# Patient Record
Sex: Female | Born: 1970 | Race: White | Hispanic: Yes | Marital: Married | Smoking: Never smoker
Health system: Southern US, Community
[De-identification: ages and names within clinical notes are randomized; demographics above are authoritative.]

## PROBLEM LIST (undated history)

## (undated) DIAGNOSIS — N3281 Overactive bladder: Secondary | ICD-10-CM

## (undated) DIAGNOSIS — D649 Anemia, unspecified: Secondary | ICD-10-CM

## (undated) DIAGNOSIS — N879 Dysplasia of cervix uteri, unspecified: Secondary | ICD-10-CM

## (undated) DIAGNOSIS — E559 Vitamin D deficiency, unspecified: Secondary | ICD-10-CM

## (undated) DIAGNOSIS — T7840XA Allergy, unspecified, initial encounter: Secondary | ICD-10-CM

## (undated) DIAGNOSIS — E221 Hyperprolactinemia: Secondary | ICD-10-CM

## (undated) HISTORY — DX: Vitamin D deficiency, unspecified: E55.9

## (undated) HISTORY — DX: Overactive bladder: N32.81

## (undated) HISTORY — DX: Hyperprolactinemia: E22.1

## (undated) HISTORY — DX: Allergy, unspecified, initial encounter: T78.40XA

## (undated) HISTORY — DX: Dysplasia of cervix uteri, unspecified: N87.9

## (undated) HISTORY — PX: CHOLECYSTECTOMY: SHX55

## (undated) HISTORY — DX: Anemia, unspecified: D64.9

---

## 2000-04-24 ENCOUNTER — Other Ambulatory Visit: Admission: RE | Admit: 2000-04-24 | Discharge: 2000-04-24 | Payer: Self-pay | Admitting: Gynecology

## 2000-05-21 ENCOUNTER — Ambulatory Visit (HOSPITAL_COMMUNITY): Admission: RE | Admit: 2000-05-21 | Discharge: 2000-05-21 | Payer: Self-pay | Admitting: Gynecology

## 2000-05-21 ENCOUNTER — Encounter: Payer: Self-pay | Admitting: Gynecology

## 2001-05-02 ENCOUNTER — Other Ambulatory Visit: Admission: RE | Admit: 2001-05-02 | Discharge: 2001-05-02 | Payer: Self-pay | Admitting: Gynecology

## 2001-06-04 ENCOUNTER — Encounter: Admission: RE | Admit: 2001-06-04 | Discharge: 2001-09-02 | Payer: Self-pay | Admitting: Gynecology

## 2001-10-12 ENCOUNTER — Encounter (INDEPENDENT_AMBULATORY_CARE_PROVIDER_SITE_OTHER): Payer: Self-pay | Admitting: Specialist

## 2001-10-12 ENCOUNTER — Inpatient Hospital Stay (HOSPITAL_COMMUNITY): Admission: AD | Admit: 2001-10-12 | Discharge: 2001-10-15 | Payer: Self-pay | Admitting: Gynecology

## 2001-11-27 ENCOUNTER — Other Ambulatory Visit: Admission: RE | Admit: 2001-11-27 | Discharge: 2001-11-27 | Payer: Self-pay | Admitting: Gynecology

## 2002-10-02 ENCOUNTER — Encounter (INDEPENDENT_AMBULATORY_CARE_PROVIDER_SITE_OTHER): Payer: Self-pay | Admitting: Specialist

## 2002-10-02 ENCOUNTER — Ambulatory Visit (HOSPITAL_COMMUNITY): Admission: RE | Admit: 2002-10-02 | Discharge: 2002-10-02 | Payer: Self-pay | Admitting: Gynecology

## 2003-02-18 ENCOUNTER — Other Ambulatory Visit: Admission: RE | Admit: 2003-02-18 | Discharge: 2003-02-18 | Payer: Self-pay | Admitting: Gynecology

## 2003-03-05 ENCOUNTER — Other Ambulatory Visit: Admission: RE | Admit: 2003-03-05 | Discharge: 2003-03-05 | Payer: Self-pay | Admitting: Gynecology

## 2003-06-21 ENCOUNTER — Inpatient Hospital Stay (HOSPITAL_COMMUNITY): Admission: AD | Admit: 2003-06-21 | Discharge: 2003-06-21 | Payer: Self-pay | Admitting: Gynecology

## 2003-10-01 ENCOUNTER — Inpatient Hospital Stay (HOSPITAL_COMMUNITY): Admission: AD | Admit: 2003-10-01 | Discharge: 2003-10-03 | Payer: Self-pay | Admitting: Gynecology

## 2003-12-08 ENCOUNTER — Other Ambulatory Visit: Admission: RE | Admit: 2003-12-08 | Discharge: 2003-12-08 | Payer: Self-pay | Admitting: Gynecology

## 2004-06-13 ENCOUNTER — Other Ambulatory Visit: Admission: RE | Admit: 2004-06-13 | Discharge: 2004-06-13 | Payer: Self-pay | Admitting: Gynecology

## 2004-12-29 ENCOUNTER — Inpatient Hospital Stay (HOSPITAL_COMMUNITY): Admission: AD | Admit: 2004-12-29 | Discharge: 2004-12-31 | Payer: Self-pay | Admitting: Gynecology

## 2005-02-14 ENCOUNTER — Other Ambulatory Visit: Admission: RE | Admit: 2005-02-14 | Discharge: 2005-02-14 | Payer: Self-pay | Admitting: Gynecology

## 2007-05-15 ENCOUNTER — Ambulatory Visit (HOSPITAL_COMMUNITY): Admission: RE | Admit: 2007-05-15 | Discharge: 2007-05-15 | Payer: Self-pay | Admitting: Gynecology

## 2007-05-23 ENCOUNTER — Encounter: Admission: RE | Admit: 2007-05-23 | Discharge: 2007-05-23 | Payer: Self-pay | Admitting: Gynecology

## 2007-07-09 ENCOUNTER — Other Ambulatory Visit: Admission: RE | Admit: 2007-07-09 | Discharge: 2007-07-09 | Payer: Self-pay | Admitting: Gynecology

## 2008-03-30 ENCOUNTER — Ambulatory Visit: Payer: Self-pay | Admitting: Gynecology

## 2008-07-23 ENCOUNTER — Encounter: Payer: Self-pay | Admitting: Gynecology

## 2008-07-23 ENCOUNTER — Other Ambulatory Visit: Admission: RE | Admit: 2008-07-23 | Discharge: 2008-07-23 | Payer: Self-pay | Admitting: Gynecology

## 2008-07-23 ENCOUNTER — Ambulatory Visit: Payer: Self-pay | Admitting: Gynecology

## 2008-09-08 ENCOUNTER — Encounter: Admission: RE | Admit: 2008-09-08 | Discharge: 2008-09-08 | Payer: Self-pay | Admitting: Gynecology

## 2009-03-09 IMAGING — MG MM DIGITAL SCREENING BILAT
5 series · 5 of 5 positions shown · non-contrast
Comparison: none

DG SCREEN MAMMOGRAM BILATERAL
Bilateral CC and MLO view(s) were taken.
Technologist: Dhindsa, Daylen.(EBADAT)(M)

DIGITAL SCREENING MAMMOGRAM WITH CAD:
The breast tissue is heterogeneously dense.  There are calcifications in the right breast.  
Characterization with magnification views is recommended.  The left breast is unremarkable.

[R CC (1 of 2)]
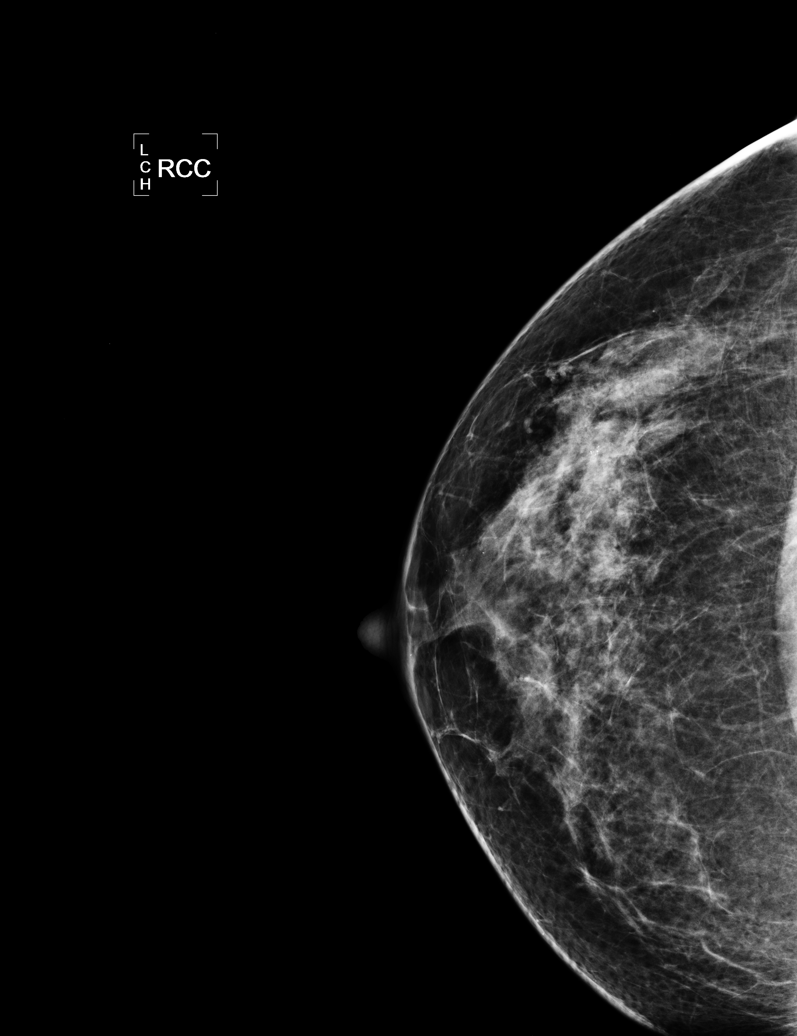

[R MLO]
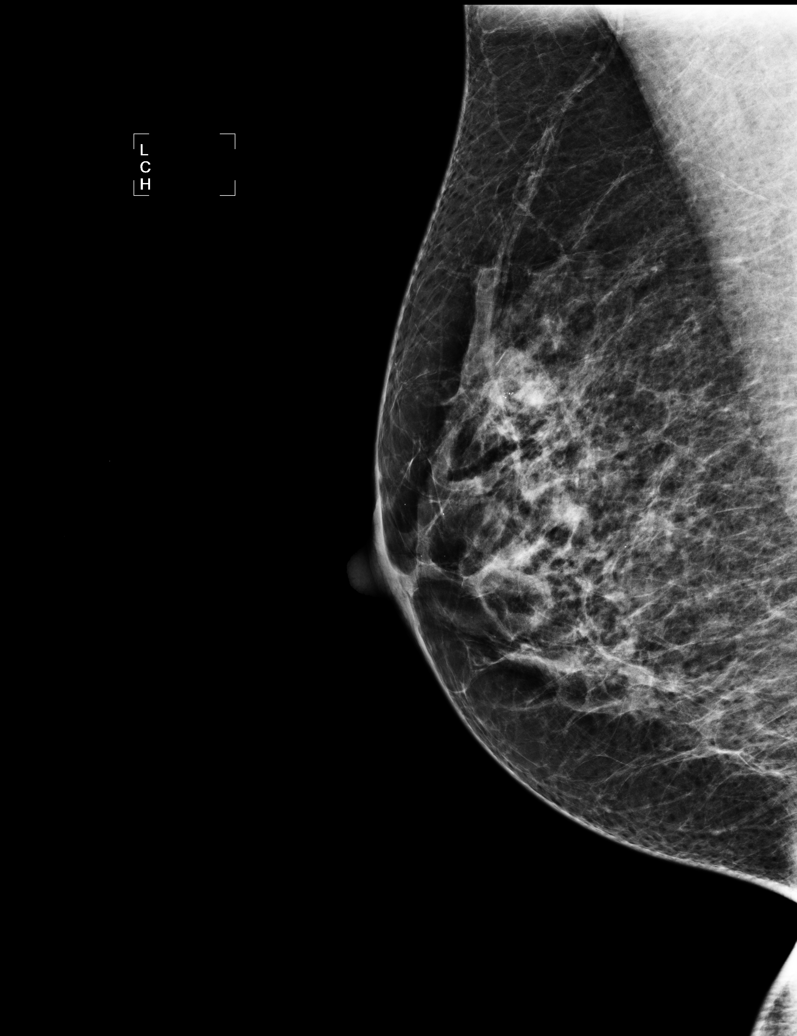

[L CC]
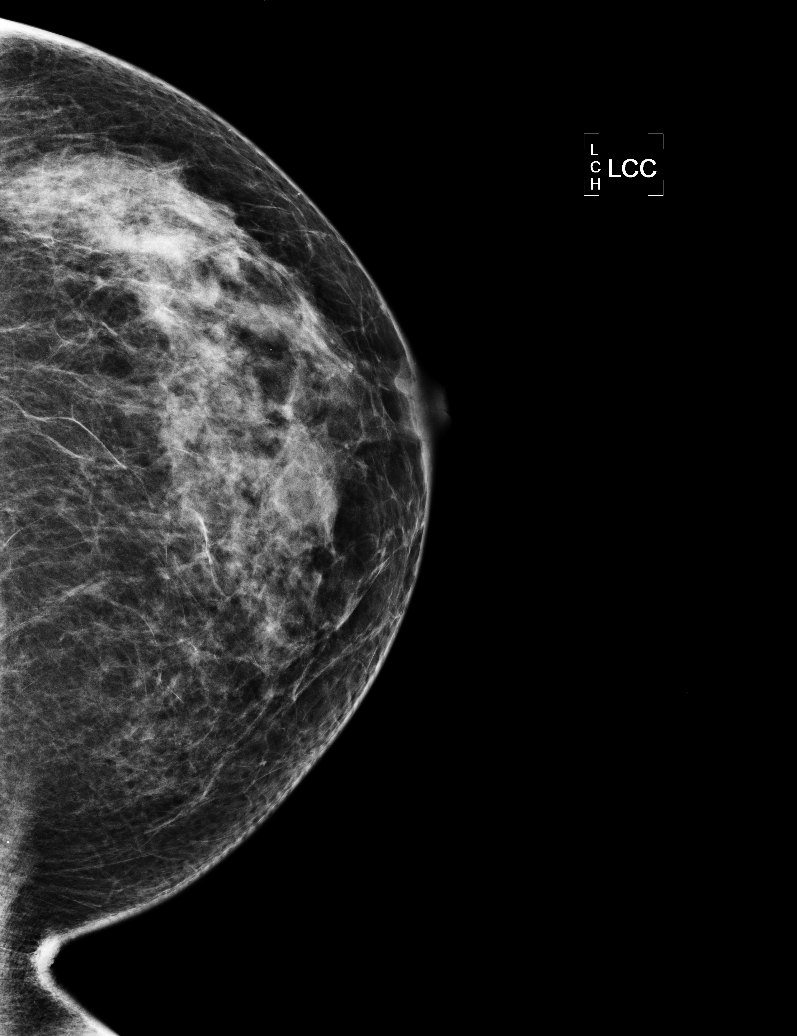

[L MLO]
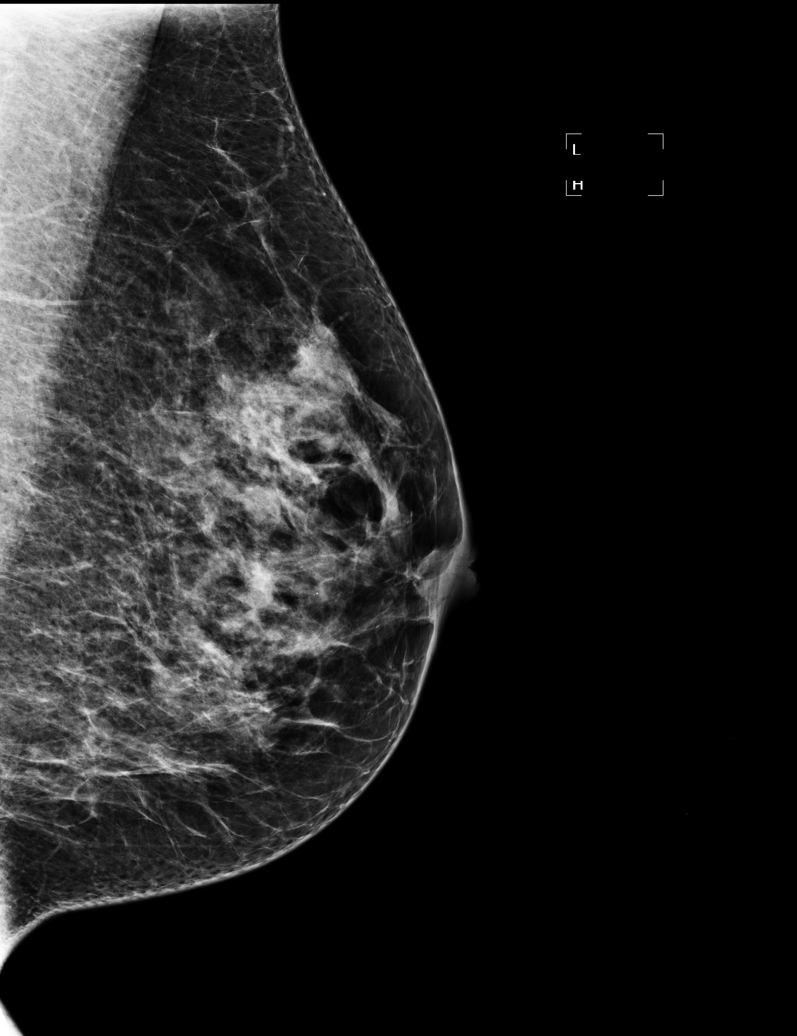

[R CC (2 of 2)]
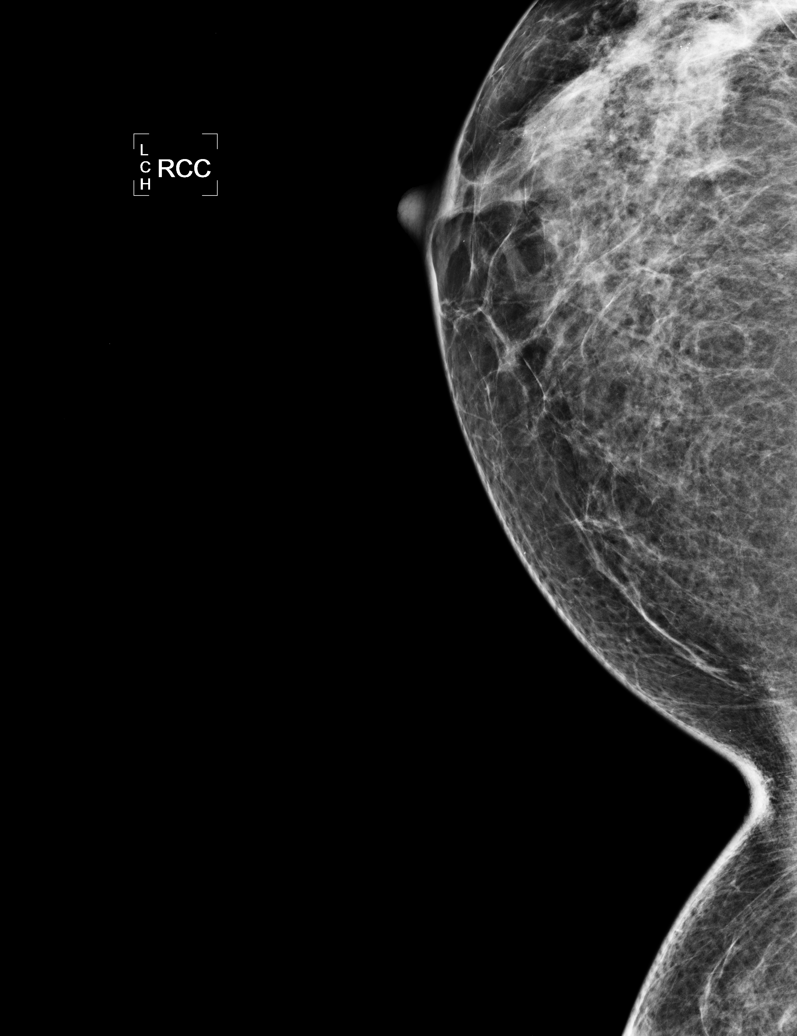

[5 of 5 positions shown; findings below may reference images not displayed]

IMPRESSION: Calcifications, right breast.  Additional evaluation is indicated.  The patient will be contacted 
for additional studies and a supplemental report will follow.

ASSESSMENT: Need additional imaging evaluation and/or prior mammograms for comparison - BI-RADS 0 -
Right

Further imaging of the right breast.
ANALYZED BY COMPUTER AIDED DETECTION. , THIS PROCEDURE WAS A DIGITAL MAMMOGRAM.

## 2009-03-17 IMAGING — MG MM DIAGNOSTIC LTD RIGHT
4 series · 4 of 4 positions shown · non-contrast
Comparison: Mammogram 05-15-07.

[REDACTED] RIGHT
CC and MLO view(s) were taken of the right breast.

DIGITAL LIMITED RIGHT  DIAGNOSTIC MAMMOGRAM:
CLINICAL DATA: Call-back from screening mammogram for right breast microcalcifications.

[R CC (1 of 2)]
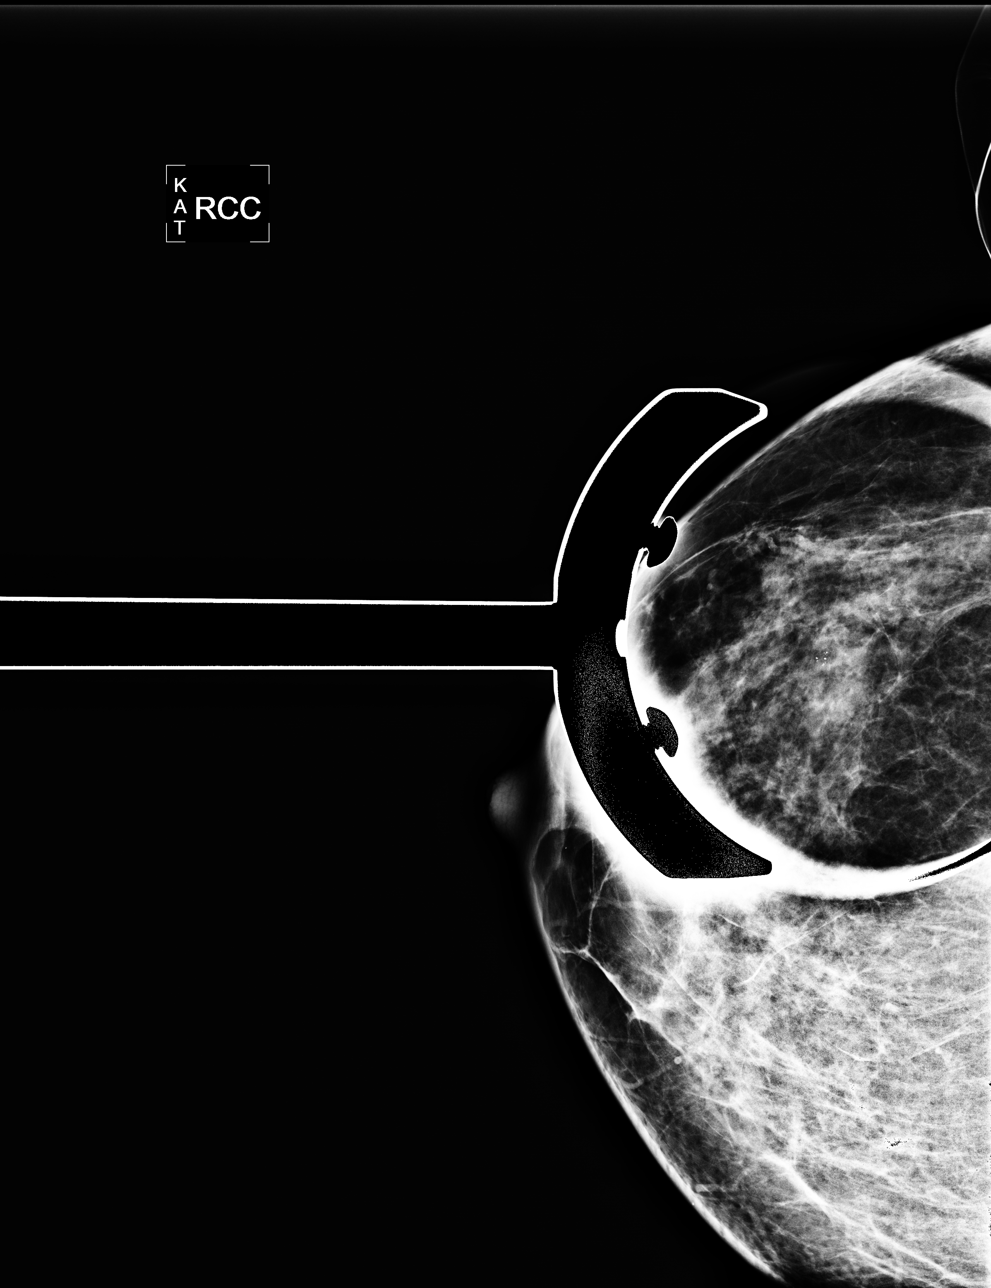

[R MLO]
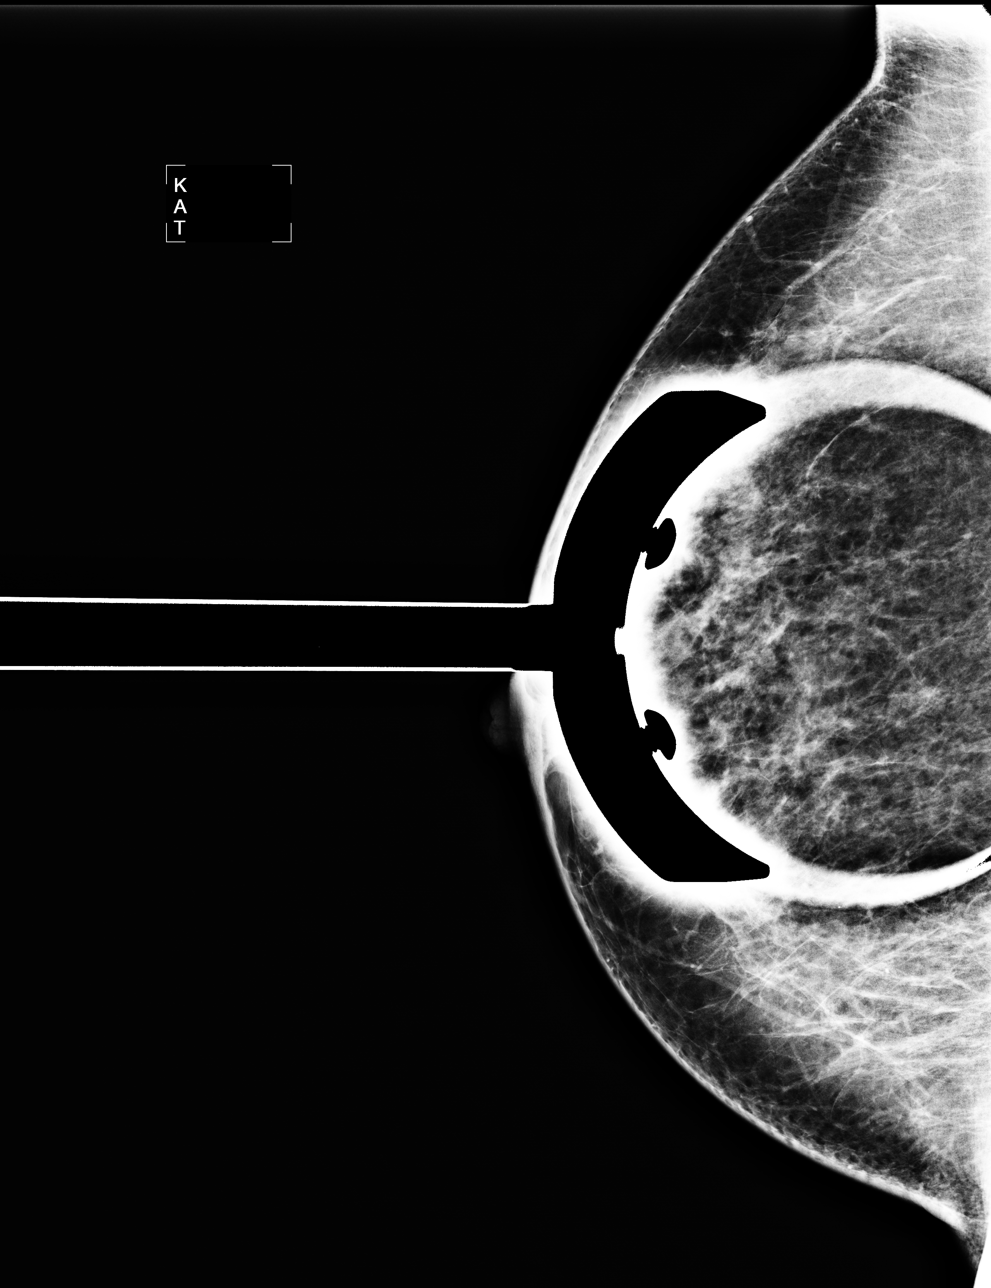

[R CC (2 of 2)]
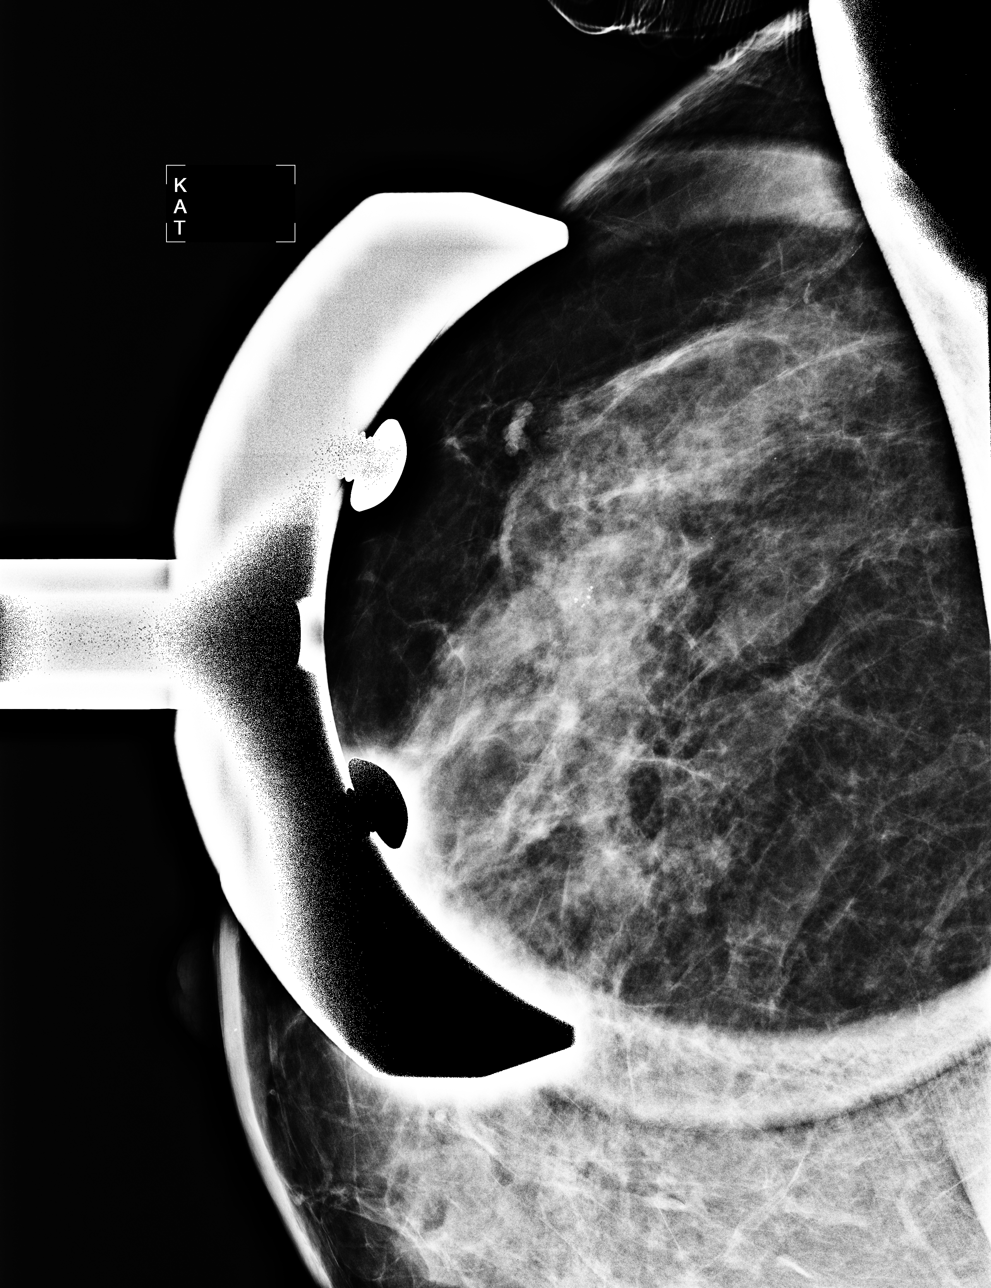

[R ML]
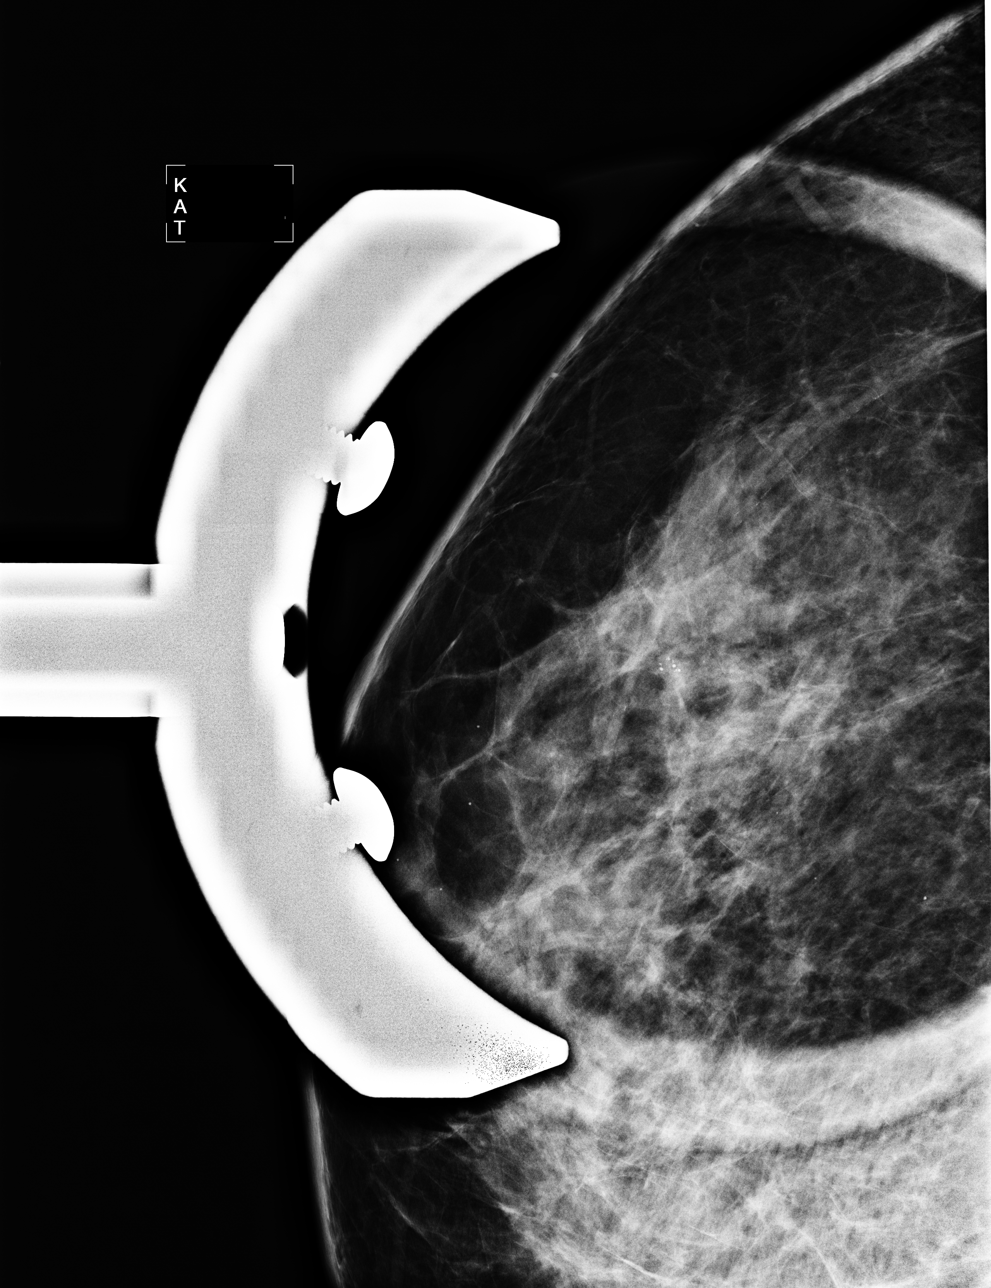

[4 of 4 positions shown; findings below may reference images not displayed]

Spot magnification CC and lateral view of the right breast are submitted for review.  The 
calcifications in the upper outer quadrant are round, punctate, coarse benign-appearing.  No 
suspicious microcalcifications are identified.
IMPRESSION: Benign findings.  Recommend routine screening mammogram at age 39.

ASSESSMENT: Benign - BI-RADS 2

Screening mammogram of both breasts at age 39.
, THIS PROCEDURE WAS A DIGITAL MAMMOGRAM.

## 2009-11-02 ENCOUNTER — Ambulatory Visit (HOSPITAL_COMMUNITY): Admission: RE | Admit: 2009-11-02 | Discharge: 2009-11-02 | Payer: Self-pay | Admitting: Gynecology

## 2010-09-01 NOTE — H&P (Signed)
NAMEGaspar Mccann                 ACCOUNT NO.:  [id number]   MEDICAL RECORD NO.:  [id number]                   PATIENT TYPE:   LOCATION:                                       FACILITY:   PHYSICIAN:  Kimberly Mccann, M.D.             DATE OF BIRTH:   DATE OF ADMISSION:  DATE OF DISCHARGE:                                HISTORY & PHYSICAL   Patient scheduled for surgery at [facility] on Friday, October 02, 2002 at  1:15 p.m.   CHIEF COMPLAINT:  First trimester missed AB.   HISTORY OF PRESENT ILLNESS:  The patient is a 40 year old, gravida 5, para  1, AB 3 with a last menstrual period of April 3 with an estimated date of  confinement of April 24, 2003; and currently would be, by last menstrual  period, approximately 10-1/[redacted] weeks gestation.  She presented to the office  yesterday, June 15, complaining of vaginal bleeding and had a quantitative  beta hCG at that time since her uterus did not correlate with a 10-week  size. The ultrasound demonstrated a nonviable intrauterine pregnancy today  consistent with 6 weeks.  A small yolk sac was noted and amnion was noted,  but no viable IUP was noted.  She did have a quantitative beta hCG on June  15 which was 10,164 million __________ units/mL and today's had dropped to  7974.   PAST MEDICAL HISTORY:  A patient with 3 previous therapeutic ABs in 1988 and  1994 respectively.  Had a normal spontaneous term pregnancy delivered at 40  weeks in 2003.  The patient with known history of hyperprolactinemia for  which she had been on __________ 0.25 mg twice a week.   ALLERGIES:  She denies any allergies.   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   FAMILY HISTORY:  Cousins and aunts with some form of Mongolism for which she  has been tested with peripheral chromosomal studies and have been normal.   PHYSICAL EXAMINATION:  HEENT:  Unremarkable.  NECK:  Supple.  Trachea is in  the midline.  No carotid bruits.  No  thyromegaly.  LUNGS:  Clear to auscultation without rhonchi or wheezes.  HEART:  Regular rate and rhythm.  No murmurs or gallops.  BREASTS: Exam not done.  ABDOMEN:  Soft, nontender, without rebound or guarding.  PELVIC:  Bartholin's, urethral and Skene's glands within normal limits.  Vagina and cervix with no gross lesions on inspection.  Vaginal blood in the  vault, but no tissue was seen on exam yesterday on June 15, and no acute  distress or any adnexal tenderness or masses.  RECTAL:  Exam not done.   ASSESSMENT:  A 40 year old gravida 5, para 1, AB 3 at 10 weeks by dates, 6  weeks by ultrasound with evidence of a missed AB and decreased quantitative  beta hCGs.  Patient with also some vaginal bleeding.  She is scheduled to  under a dilatation and evacuation on Friday, June 18 at 1:15 p.m.   Review of her records indicate, that with her previous pregnancy, her blood  type was O positive.  The situation and clinical diagnosis that was made was  discussed with the patient in Spanish and she was counseled as to the risk  involved in a D&C: Basically infection, bleeding, or trauma to internal  organs.  All questions were answered and will follow accordingly.   PLAN:  Patient schedule for a D&E, Friday, June 18 at 1:15 p.m. at [facility].                                               Poway H. Lily Mccann, M.D.    JHF/MEDQ  D:  09/30/2002  T:  09/30/2002  Job:  [id number]

## 2010-09-01 NOTE — Discharge Summary (Signed)
   NAMEDaiva Mccann                        ACCOUNT NO.:  [id number]   MEDICAL RECORD NO.:  [id number]                   PATIENT TYPE:  NP   LOCATION:  9133                                 FACILITY:  [facility]   PHYSICIAN:  Elwyn Lade . Hancock, N.P.          DATE OF BIRTH:  [birth year]   DATE OF ADMISSION:  10/12/2001  DATE OF DISCHARGE:  10/15/2001                                 DISCHARGE SUMMARY   DISCHARGE DIAGNOSES:  Intrauterine pregnancy, at term, spontaneous onset of  labor.   PROCEDURES:  Normal spontaneous vaginal delivery, viable infant over intact  peritoneum with repaired second-degree lacerations.   HISTORY OF PRESENT ILLNESS:  The patient is a 40 year old gravida 4, para 0-  0-3-0, with an LMP of January 07, 2001, [facility] October 12, 2001. 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 GBS negative.   HOSPITAL COURSE AND TREATMENT:  The patient was admitted on October 12, 2001,  with spontaneous onset of labor. She progressed to complete dilatation,  delivered an Apgar 5 and 57 female infant, weight 7 pounds 1 ounce, over  intact perineum with repair of second-degree midline laceration. She did  have a mild temperature elevation after delivery, otherwise, postpartum  course remained normal. She was able to discharged in satisfactory condition  on her second postpartum day. CBC:  Hematocrit 31.5, hemoglobin 10.5, WBC  10.9, platelets 191.   DISPOSITION:  Followup in six weeks.  Continue with prenatal vitamins and  iron, Motrin for pain.                                                 Elwyn Lade . Cato Mulligan, N.P.    MKH/MEDQ  D:  11/10/2001  T:  11/16/2001  Job:  [id number]

## 2010-09-01 NOTE — Op Note (Signed)
Kimberly Mccann                        ACCOUNT NO.:  [id number]   MEDICAL RECORD NO.:  [id number]                   PATIENT TYPE:  INP   LOCATION:  9133                                 FACILITY:  [facility]   PHYSICIAN:  Juan H. Lily Peer, M.D.             DATE OF BIRTH:  Feb 14, 1971   DATE OF PROCEDURE:  10/02/2002  DATE OF DISCHARGE:  10/15/2001                                 OPERATIVE REPORT   PREOPERATIVE DIAGNOSIS:  First trimester missed abortion.   POSTOPERATIVE DIAGNOSIS:  First trimester missed abortion.   PROCEDURE PERFORMED:  Dilatation and evacuation.   SURGEON:  Juan H. Lily Peer, M.D.   ANESTHESIA:  MAC along with a paracervical block consisting of 2% Xylocaine  with 1:100,000 epinephrine.   INDICATION FOR OPERATION:  A 40 year old gravida 5, para 1, AB 3 (elective  ABs) with first trimester missed AB, decrease in quantitive beta HCGs, and  nonviable intrauterine pregnancy witnessed on ultrasound.   FINDINGS:  An eight to 10 weeks' size anteverted uterus, no adnexal masses,  and a moderate amount of tissue was extruded via the D&E procedure.   DESCRIPTION OF OPERATION:  After the patient was adequately counseled, she  was taken to the operating room.  She was placed in the low lithotomy  position.  The vagina and perineum were prepped and draped in the usual  sterile fashion.  A red rubber Roxan Hockey was utilized to evacuate the bladder  contents for approximately 50 mL.  Examination then demonstrated a uterus 10-  week size, anteverted, no palpable adnexal masses.  A Graves speculum was  inserted to the vaginal vault.  The cervix was cleansed with Betadine  solution.  A single-tooth tenaculum was placed on the anterior cervical lip  and 2% Xylocaine with 1:100,000 epinephrine was infiltrated into the  cervical stroma at the 2, 4, 8, and 10 o'clock position for a total of 10  mL.  The cervix required minimal dilatation, but it was dilated to a size 31  Pratt  dilator.  A 10 mm suction curette was introduced into the intrauterine  cavity to remove the products of conception.  This was interchanged with a  Hunter's curette to ascertain complete evacuation of the intrauterine  contents.  The single-tooth tenaculum was removed.  The patient did receive  30 mg of Toradol en route to recovery and prior to this she had received 1 g  of Cefotan for prophylaxis.  The patient was transferred to the recovery  room with stable vital signs.  Blood loss was less than 50 mL.  IV fluids  were 400 mL of lactated Ringer's.  Had in-and-out catheter at 50 mL prior to  the commencement of her surgery when her bladder was empty.  Blood type is O  positive.  Juan H. Lily Peer, M.D.    JHF/MEDQ  D:  10/02/2002  T:  10/02/2002  Job:  [id number]

## 2012-02-04 ENCOUNTER — Other Ambulatory Visit (HOSPITAL_COMMUNITY): Payer: Self-pay | Admitting: Physician Assistant

## 2012-02-04 DIAGNOSIS — Z1231 Encounter for screening mammogram for malignant neoplasm of breast: Secondary | ICD-10-CM

## 2012-02-22 ENCOUNTER — Ambulatory Visit (HOSPITAL_COMMUNITY)
Admission: RE | Admit: 2012-02-22 | Discharge: 2012-02-22 | Disposition: A | Discharge status: Home / Self Care | Payer: Self-pay | Source: Ambulatory Visit | Attending: Physician Assistant | Admitting: Physician Assistant

## 2012-02-22 DIAGNOSIS — Z1231 Encounter for screening mammogram for malignant neoplasm of breast: Secondary | ICD-10-CM

## 2013-09-04 ENCOUNTER — Ambulatory Visit: Payer: BC Managed Care – PPO | Admitting: Women's Health

## 2013-09-14 ENCOUNTER — Encounter: Payer: Self-pay | Admitting: Gynecology

## 2013-09-15 ENCOUNTER — Ambulatory Visit: Payer: Self-pay | Admitting: Gynecology

## 2013-10-01 ENCOUNTER — Ambulatory Visit: Payer: Self-pay | Admitting: Gynecology

## 2013-11-03 ENCOUNTER — Ambulatory Visit (INDEPENDENT_AMBULATORY_CARE_PROVIDER_SITE_OTHER): Payer: BC Managed Care – PPO | Admitting: Family Medicine

## 2013-11-03 VITALS — BP 118/62 | HR 74 | Temp 98.6°F | Resp 16 | Ht 63.5 in | Wt 157.0 lb

## 2013-11-03 DIAGNOSIS — L299 Pruritus, unspecified: Secondary | ICD-10-CM

## 2013-11-03 LAB — POCT SKIN KOH: SKIN KOH, POC: NEGATIVE

## 2013-11-03 MED ORDER — TRIAMCINOLONE ACETONIDE 0.1 % EX CREA: 1.0000 "application " | TOPICAL_CREAM | Freq: Two times a day (BID) | CUTANEOUS | Status: DC

## 2013-11-03 NOTE — Progress Notes (Signed)
Subjective:    Patient ID: Kimberly Mccann, female    DOB: 10/17/1970, [age] y.o.   MRN: [id number]  HPI Kimberly Mccann is a 43 y.o. female  Itching in scalp and some hair loss past 10 days or so.  Notices in front left part of scalp.  No similar sx's in past. No new medications. No new vitamins.  Takes otc MVI, and calcium. No new hair treatments or dermatologic products.   Son had ringworm few weeks ago - in hair. Son is taking pills.   Tx: none.   There are no active problems to display for this patient.  History reviewed. No pertinent past medical history. History reviewed. No pertinent past surgical history. No Known Allergies Prior to Admission medications   Not on File   History   Social History  . Marital Status: Married    Spouse Name: N/A    Number of Children: N/A  . Years of Education: N/A   Occupational History  . Not on file.   Social History Main Topics  . Smoking status: Never Smoker   . Smokeless tobacco: Not on file  . Alcohol Use: Not on file  . Drug Use: Not on file  . Sexual Activity: Not on file   Other Topics Concern  . Not on file   Social History Narrative  . No narrative on file     Review of Systems  Constitutional: Negative for fever and chills.  Skin: Negative for rash.       Feels like thinner hair L front scalp.   Neurological: Negative for headaches.  Psychiatric/Behavioral: The patient is not nervous/anxious.        Objective:   Physical Exam  Vitals reviewed. Constitutional: She is oriented to person, place, and time. She appears well-developed and well-nourished. No distress.  HENT:  Head: Normocephalic and atraumatic.    Pulmonary/Chest: Effort normal.  Neurological: She is alert and oriented to person, place, and time.  Skin: Skin is warm, dry and intact. No rash noted.   Filed Vitals:   11/03/13 1708  BP: 118/62  Pulse: 74  Temp: 98.6 F (37 C)  Resp: 16  Height: 5' 3.5" (1.613 m)  Weight: 157 lb (71.215  kg)  SpO2: 98%   Results for orders placed in visit on 11/03/13  POCT SKIN KOH      Result Value Ref Range   Skin KOH, POC Negative        Assessment & Plan:   Kimberly Mccann is a 43 y.o. female Itchy scalp - Plan: POCT Skin KOH, triamcinolone cream (KENALOG) 0.1 % No apparent broken hairs, less likely tinea capitis, can try topical steroid for now, but if persistent symptoms over next few weeks, return for recheck and to consider griseofulvin if more convincing for tinea. rtc precautions.     Meds ordered this encounter  Medications  . triamcinolone cream (KENALOG) 0.1 %    Sig: Apply 1 application topically 2 (two) times daily. To affected area as needed.    Dispense:  30 g    Refill:  0   Patient Instructions  Your hair exam does not indicate fungus at this time.  I prescribed a steroid cream to help with itching if needed, but if itching or hair loss not improving in next 2 weeks - return for recheck.  Return to the clinic or go to the nearest emergency room if any of your symptoms worsen or new symptoms occur.

## 2013-11-03 NOTE — Patient Instructions (Addendum)
Your hair exam does not indicate fungus at this time.  I prescribed a steroid cream to help with itching if needed, but if itching or hair loss not improving in next 2 weeks - return for recheck.  Return to the clinic or go to the nearest emergency room if any of your symptoms worsen or new symptoms occur.

## 2013-11-11 ENCOUNTER — Encounter: Payer: Self-pay | Admitting: Gynecology

## 2013-11-11 ENCOUNTER — Other Ambulatory Visit (HOSPITAL_COMMUNITY)
Admission: RE | Admit: 2013-11-11 | Discharge: 2013-11-11 | Disposition: A | Discharge status: Home / Self Care | Payer: BC Managed Care – PPO | Source: Ambulatory Visit | Attending: Gynecology | Admitting: Gynecology

## 2013-11-11 ENCOUNTER — Ambulatory Visit (INDEPENDENT_AMBULATORY_CARE_PROVIDER_SITE_OTHER): Payer: BC Managed Care – PPO | Admitting: Gynecology

## 2013-11-11 VITALS — BP 130/80 | Ht 64.0 in | Wt 167.0 lb

## 2013-11-11 DIAGNOSIS — Z8639 Personal history of other endocrine, nutritional and metabolic disease: Secondary | ICD-10-CM

## 2013-11-11 DIAGNOSIS — Z23 Encounter for immunization: Secondary | ICD-10-CM

## 2013-11-11 DIAGNOSIS — Z01419 Encounter for gynecological examination (general) (routine) without abnormal findings: Secondary | ICD-10-CM | POA: Insufficient documentation

## 2013-11-11 DIAGNOSIS — Z862 Personal history of diseases of the blood and blood-forming organs and certain disorders involving the immune mechanism: Secondary | ICD-10-CM

## 2013-11-11 DIAGNOSIS — Z1151 Encounter for screening for human papillomavirus (HPV): Secondary | ICD-10-CM | POA: Insufficient documentation

## 2013-11-11 DIAGNOSIS — L659 Nonscarring hair loss, unspecified: Secondary | ICD-10-CM | POA: Insufficient documentation

## 2013-11-11 DIAGNOSIS — Z87410 Personal history of cervical dysplasia: Secondary | ICD-10-CM | POA: Insufficient documentation

## 2013-11-11 LAB — CBC WITH DIFFERENTIAL/PLATELET
BASOS ABS: 0 10*3/uL (ref 0.0–0.1)
Basophils Relative: 0 % (ref 0–1)
EOS PCT: 1 % (ref 0–5)
Eosinophils Absolute: 0.1 10*3/uL (ref 0.0–0.7)
HEMATOCRIT: 38 % (ref 36.0–46.0)
Hemoglobin: 13 g/dL (ref 12.0–15.0)
LYMPHS ABS: 2.3 10*3/uL (ref 0.7–4.0)
LYMPHS PCT: 36 % (ref 12–46)
MCH: 29.5 pg (ref 26.0–34.0)
MCHC: 34.2 g/dL (ref 30.0–36.0)
MCV: 86.4 fL (ref 78.0–100.0)
Monocytes Absolute: 0.5 10*3/uL (ref 0.1–1.0)
Monocytes Relative: 7 % (ref 3–12)
Neutro Abs: 3.6 10*3/uL (ref 1.7–7.7)
Neutrophils Relative %: 56 % (ref 43–77)
Platelets: 262 10*3/uL (ref 150–400)
RBC: 4.4 MIL/uL (ref 3.87–5.11)
RDW: 13.3 % (ref 11.5–15.5)
WBC: 6.5 10*3/uL (ref 4.0–10.5)

## 2013-11-11 LAB — HEMOGLOBIN A1C
Hgb A1c MFr Bld: 5.7 % — ABNORMAL HIGH (ref <5.7)
Mean Plasma Glucose: 117 mg/dL — ABNORMAL HIGH (ref <117)

## 2013-11-11 NOTE — Addendum Note (Signed)
Addended by: Berna SpareASTILLO, BLANCA A on: 11/11/2013 10:28 AM   Modules accepted: Orders

## 2013-11-11 NOTE — Progress Notes (Signed)
Kimberly Mccann 11-28-1970 [id number]   History:    43 y.o.  for annual gyn exam who has not been seeing in the office since 2010. Patient's only complaint has been of occasional hair loss. The patient's previous annual exam was within health Department which she said was approximately 2 years ago and had a normal Pap smear for which we do not have documentation. Review of patient's old record from our office indicated that she had history in the past of hyperprolactinemia whereby she had been on Dostinex 0.25 mg twice a week. She is no longer on medication. She denies any unusual headaches blurry vision or nipple discharge. She reports normal menstrual cycles and is using spermicidal gel for contraception. She had her Mirena IUD removed at the health department a few years ago. She is interested in looking and other options for contraception. 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 Her mammogram was also 2 years ago reportedly normal but dense.  Past medical history,surgical history, family history and social history were all reviewed and documented in the EPIC chart.  Gynecologic History Patient's last menstrual period was 10/21/2013. Contraception: vaginal spermicide Last Pap: Over 3 years ago. Results were: Patient reports that was normal we have no documentation Last mammogram: 2 years ago. Results were: normal  Obstetric History OB History  Gravida Para Term Preterm AB SAB TAB Ectopic Multiple Living  4 3   1 1    3     # Outcome Date GA Lbr Len/2nd Weight Sex Delivery Anes PTL Lv  4 SAB           3 PAR           2 PAR           1 PAR                ROS: A ROS was performed and pertinent positives and negatives are included in the history.  GENERAL: No fevers or chills. HEENT: No change in vision, no earache, sore throat or sinus congestion. NECK: No pain or stiffness. CARDIOVASCULAR: No chest pain or pressure. No  palpitations. PULMONARY: No shortness of breath, cough or wheeze. GASTROINTESTINAL: No abdominal pain, nausea, vomiting or diarrhea, melena or bright red blood per rectum. GENITOURINARY: No urinary frequency, urgency, hesitancy or dysuria. MUSCULOSKELETAL: No joint or muscle pain, no back pain, no recent trauma. DERMATOLOGIC: No rash, no itching, no lesions. ENDOCRINE: No polyuria, polydipsia, no heat or cold intolerance. No recent change in weight. HEMATOLOGICAL: No anemia or easy bruising or bleeding. NEUROLOGIC: No headache, seizures, numbness, tingling or weakness. PSYCHIATRIC: No depression, no loss of interest in normal activity or change in sleep pattern.     Exam: chaperone present  BP 130/80  Ht 5\' 4"  (1.626 m)  Wt 167 lb (75.751 kg)  BMI 28.65 kg/m2  LMP 10/21/2013  Body mass index is 28.65 kg/(m^2).  General appearance : Well developed well nourished female. No acute distress HEENT: Neck supple, trachea midline, no carotid bruits, no thyroidmegaly Lungs: Clear to auscultation, no rhonchi or wheezes, or rib retractions  Heart: Regular rate and rhythm, no murmurs or gallops Breast:Examined in sitting and supine position were symmetrical in appearance, no palpable masses or tenderness,  no skin retraction, no nipple inversion, no nipple discharge, no skin discoloration, no axillary or supraclavicular lymphadenopathy Abdomen: no palpable masses or tenderness, no rebound or guarding Extremities: no edema or skin discoloration  or tenderness  Pelvic:  Bartholin, Urethra, Skene Glands: Within normal limits             Vagina: No gross lesions or discharge  Cervix: No gross lesions or discharge  Uterus  anteverted, normal size, shape and consistency, non-tender and mobile  Adnexa  Without masses or tenderness  Anus and perineum  normal   Rectovaginal  normal sphincter tone without palpated masses or tenderness             Hemoccult not indicated     Assessment/Plan:  43 y.o.  female for annual exam who many years ago had laser of cervix secondary to dysplasia. Pap smear with HPV will be obtained today. Because of past history of hyperprolactinemia will check her prolactin. In addition the following labs were or: CBC, screening cholesterol, hemoglobin A1c, TSH as well as comprehensive metabolic panel and urinalysis. Tdap vaccine was administered today. Patient was reminded to schedule her mammogram and to request for 3-D. We discussed importance of calcium vitamin D and regular exercise for osteoporosis prevention.  Note: This dictation was prepared with  Dragon/digital dictation along withSmart phrase technology. Any transcriptional errors that result from this process are unintentional.   Ok EdwardsFERNANDEZ,JUAN H MD, 10:11 AM 11/11/2013

## 2013-11-11 NOTE — Patient Instructions (Signed)
Vacuna contra el ttanos, la difteria y la tos ferina (Tdap): lo que debe saber (Tdap Vaccine (Tetanus, Diphtheria, Pertussis): What You Need to Know) 1. Por qu vacunarse? El ttanos, la difteria y la tos ferina pueden ser enfermedades muy graves, incluso para los adolescentes y los adultos. La vacuna Tdap nos puede proteger de estas enfermedades. El TTANOS (trismo) provoca entumecimiento y contraccin dolorosa de los msculos, por lo general, en todo el cuerpo.  Puede causar el endurecimiento de los msculos de la cabeza y el cuello, de modo que impide abrir la boca, tragar y en algunos casos, respirar. El ttanos causa la muerte de 1 de cada 5 personas que se infectan. La DIFTERIA puede hacer que se forme una membrana gruesa en el fondo de la garganta.  Puede causar problemas respiratorios, parlisis, insuficiencia cardaca e incluso la muerte. La TOS FERINA causa episodios de tos graves, que pueden hacer difcil la respiracin y causar vmitos y trastornos del sueo.  Tambin puede ser la causa de prdida de peso, incontinencia y fractura de costillas. Dos de cada 100 adolescentes y 5 de cada 100 adultos que se enferman de tos ferina deben ser hospitalizados o tienen complicaciones, que podran incluir neumona y muerte. Estas enfermedades son provocadas por bacterias. La difteria y el pertusis se contagian de persona a persona a travs de la tos o el estornudo. El ttanos ingresa al organismo a travs de cortes, rasguos o heridas. Antes de las vacunas, en los [place] se vieron ms de 200000 casos al ao de difteria y tos ferina y cientos de casos de ttanos. Desde el inicio de la vacunacin, los casos de ttanos y difteria han disminuido alrededor del 99% y los casos de tos ferina alrededor del 80%. 2. Vacuna Tdap La vacuna Tdap protege a adolescentes y adultos contra el ttanos, la difteria y la tos ferina. Una dosis de Tdap se administra a los 11 o 12 aos. Las personas que no  recibieron la vacuna Tdap a esa edad deben recibirla tan pronto como sea posible. Es muy importante que los mdicos y todos aquellos que tengan contacto cercano con bebs menores de 12 meses reciban la Tdap. Las mujeres deben recibir una dosis de Tdap en cada embarazo, para proteger al recin nacido de la tos ferina. Los nios tienen mayor riesgo de complicaciones graves y potencialmente mortales debido a la tos ferina. Una vacuna similar, llamada Td, protege contra el ttanos y la difteria, pero no contra la tos ferina. Cada 10 aos debe recibirse un refuerzo de Td. La Tdap se puede administrar como uno de estos refuerzos, si todava no ha recibido una dosis. Tambin se puede aplicar despus de un corte o quemadura grave para prevenir la infeccin por ttanos. El mdico le dar ms informacin. La Tdap puede administrarse de manera segura simultneamente con otras vacunas. 3. Algunas personas no deben recibir esta vacuna  Si alguna vez tuvo una reaccin alrgica potencialmente mortal despus de una dosis de la vacuna contra el ttanos, la diferia o la tos ferina, O tuvo una alergia grave a cualquiera de los componentes de esta vacuna, no debe aplicarse la vacuna. Informe a su mdico si usted sufre algn tipo de alergia grave.  Si estuvo en coma o sufri mltiples convulsiones dentro de los 7 das posteriores despus de una dosis de DTP o DTaP, no debe recibir la Tdap, salvo que se encuentre otra causa. En este caso puede recibir la Td.  Consulte con su mdico si:  tiene   epilepsia u otra enfermedad del sistema nervioso,  siente dolor intenso o se hincha despus de recibir cualquier vacuna contra la difteria, el ttanos o la tos ferina,  alguna vez ha sufrido el sndrome de Guillain-Barr,  no se siente bien el da en que se ha programado la vacuna. 4. Riesgos de una reaccin a la vacuna Con cualquier medicamento, incluyendo las vacunas, existe la posibilidad de que aparezcan efectos  secundarios. Estos son leves y desaparecen por s solos, pero tambin son posibles las reacciones graves. Despus de una vacuna, es posible tener desmayos breves, que causen lesiones por la cada. Sentarse o recostarse durante 15 minutos puede ayudar a evitarlo. Informe al mdico si se siente mareado o aturdido, tiene cambios en la visin o zumbidos en los odos. Problemas leves despus de la vacuna Tdap (que no interfirieron en otras actividades)  Dolor en el sitio de la inyeccin (alrededor de 1 de cada 4 adolescentes o 2 de cada 3 adultos).  Enrojecimiento o hinchazn en el lugar de la inyeccin (alrededor de 1 de cada 5 personas)  Fiebre leve de al menos 100,4F (38C) (hasta alrededor de 1 cada 25 adolescentes o 1 de cada 100 adultos)  Dolor de cabeza (alrededor de 3 o 4 de cada 10 personas )  Cansancio (alrededor de 1 de cada 3 o 4 personas)  Nuseas, vmitos, diarrea, dolor de estmago (hasta 1 de cada 4 adolescentes o 1 de cada 10 adultos)  Escalofros, dolores corporales, dolores articulares, erupciones, inflamacin de las glndulas (poco frecuente). Problemas moderados despus de la vacuna Tdap (que interfirieron en las actividades, pero no exigieron atencin mdica)  Dolor en el lugar de la inyeccin (alrededor de 1 de cada 5 adolescentes o 1 de cada 100 adultos)  Enrojecimiento o inflamacin (hasta 1 de cada 16 adolescentes y 1 de cada 25 adultos)  Fiebre de ms de 102F (38,8C) (alrededor de 1 de cada 100 adolescentes o 1 de cada 250 adultos)  Dolor de cabeza (alrededor de 3 de cada 20 adolescentes y 1 de cada 10 adultos)  Nuseas, vmitos, diarrea, dolor de estmago (hasta 1 o 3 de cada 100 personas)  Hinchazn de todo el brazo en el que se aplic la vacuna (hasta alrededor de 3 de cada 100 personas). Problemas graves despus de la vacuna Tdap (que impidieron realizar las actividades habituales; exigieron atencin mdica)  Inflamacin, dolor intenso, sangrado y  enrojecimiento en el brazo, en el sitio de la inyeccin (poco frecuente). Despus de la administracin de cualquier vacuna, puede ocurrir una reaccin alrgica grave (se calcula que menos de 1 en un milln de dosis). 5. Qu pasa si hay una reaccin grave? A qu signos debo estar atento?  Observe todo lo que le preocupe, como signos de una reaccin alrgica grave, fiebre muy alta o cambios en el comportamiento. Los signos de una reaccin alrgica grave pueden incluir ronchas, hinchazn de la cara y la garganta, dificultad para respirar, latidos cardacos acelerados, mareos y debilidad. Pueden comenzar entre unos pocos minutos y algunas horas despus de la vacunacin. Qu debo hacer?  Si usted piensa que se trata de una reaccin alrgica grave o de otra emergencia que no puede esperar, llame al 911 o lleve a la persona al hospital ms cercano. Sino, llame a su mdico.  Despus, la reaccin debe informarse al Sistema de Informacin sobre Efectos Adversos de las Vacunas (Vaccine Adverse Event Reporting System,VAERS). Su mdico puede presentar este informe, o puede hacerlo usted mismo a travs del sitio web   de VAERS, en www.vaers.hhs.gov, o llamando al 1-800-822-7967. El VAERS es solo para informar reacciones. No brindan consejo mdico. 6. Programa Nacional de Compensacin de Daos por Vacunas El Programa Nacional de Compensacin de Daos por Vacunas (National Vaccine Injury Compensation Program, VICP) es un programa federal que fue creado para compensar a las personas que puedan haber sufrido daos al recibir ciertas vacunas. Aquellas personas que consideren que han sufrido un dao como consecuencia de una vacuna y quieren saber ms acerca del programa y cmo presentar una denuncia, pueden llamar al 1-800-338-2382 o visitar su sitio web en www.hrsa.gov/vaccinecompensation. 7. Cmo puedo obtener ms informacin?  Consulte a su mdico.  Comunquese con el servicio de salud de su localidad o su  estado.  Comunquese con los Centros para el Control y la Prevencin de Enfermedades (Centers for Disease Control and Prevention , CDC).  Llame al 1-800-232-4636 o visite el sitio web de los CDC en www.cdc.gov/vaccines. Declaracin de informacin sobre la vacuna contra la difteria, el ttanos y la tos ferina (Tdap) de los CDC (08/23/11) Document Released: 03/19/2012 Document Revised: 08/17/2013 ExitCare Patient Information 2015 [employer]. This information is not intended to replace advice given to you by your health care provider. Make sure you discuss any questions you have with your health care provider.  

## 2013-11-11 NOTE — Addendum Note (Signed)
Addended by: Berna SpareASTILLO, Daryl Beehler A on: 11/11/2013 10:24 AM   Modules accepted: Orders

## 2013-11-12 LAB — TSH: TSH: 0.736 u[IU]/mL (ref 0.350–4.500)

## 2013-11-12 LAB — URINALYSIS W MICROSCOPIC + REFLEX CULTURE
Bilirubin Urine: NEGATIVE
Casts: NONE SEEN
Crystals: NONE SEEN
Glucose, UA: NEGATIVE mg/dL
HGB URINE DIPSTICK: NEGATIVE
KETONES UR: NEGATIVE mg/dL
Leukocytes, UA: NEGATIVE
NITRITE: NEGATIVE
Protein, ur: NEGATIVE mg/dL
Specific Gravity, Urine: 1.005 (ref 1.005–1.030)
UROBILINOGEN UA: 0.2 mg/dL (ref 0.0–1.0)
pH: 7.5 (ref 5.0–8.0)

## 2013-11-12 LAB — COMPREHENSIVE METABOLIC PANEL
ALBUMIN: 4.4 g/dL (ref 3.5–5.2)
ALT: 22 U/L (ref 0–35)
AST: 27 U/L (ref 0–37)
Alkaline Phosphatase: 49 U/L (ref 39–117)
BUN: 8 mg/dL (ref 6–23)
CO2: 19 meq/L (ref 19–32)
Calcium: 9.3 mg/dL (ref 8.4–10.5)
Chloride: 104 mEq/L (ref 96–112)
Creat: 0.5 mg/dL (ref 0.50–1.10)
GLUCOSE: 79 mg/dL (ref 70–99)
POTASSIUM: 4.1 meq/L (ref 3.5–5.3)
SODIUM: 137 meq/L (ref 135–145)
TOTAL PROTEIN: 7.7 g/dL (ref 6.0–8.3)
Total Bilirubin: 0.5 mg/dL (ref 0.2–1.2)

## 2013-11-12 LAB — CYTOLOGY - PAP

## 2013-11-12 LAB — CHOLESTEROL, TOTAL: CHOLESTEROL: 160 mg/dL (ref 0–200)

## 2013-11-24 ENCOUNTER — Encounter: Payer: Self-pay | Admitting: Gynecology

## 2013-11-24 ENCOUNTER — Telehealth: Payer: Self-pay | Admitting: *Deleted

## 2013-11-24 DIAGNOSIS — Z8639 Personal history of other endocrine, nutritional and metabolic disease: Secondary | ICD-10-CM

## 2013-11-24 NOTE — Telephone Encounter (Signed)
Pt called with lab results from [facility] 11/11/13, pt prolactin level was never drawn from [facility]. Since it is stated in note, order will be placed and pt will come and have drawn.

## 2013-11-26 ENCOUNTER — Other Ambulatory Visit: Payer: BC Managed Care – PPO

## 2013-11-26 DIAGNOSIS — Z8639 Personal history of other endocrine, nutritional and metabolic disease: Secondary | ICD-10-CM

## 2013-11-26 LAB — PROLACTIN: Prolactin: 17.6 ng/mL

## 2013-11-30 ENCOUNTER — Telehealth: Payer: Self-pay

## 2013-11-30 NOTE — Telephone Encounter (Signed)
Patient informed. She states she is having headaches with some blurry vision.  I did remind her that Dr. Glenetta HewJF asked her at office visit end of July and she denied having these sx.  She said she did because she just really has not paid attention to it.  She has been paying attention to it and now notices these things.  I told her that likely Dr. Glenetta HewJF would recommend a neurology consult but I would need to check with him. She said she is not sure she wants to proceed with that at this time but she will call back if she decides and we can check with Dr. Glenetta HewJF and see if he wants to refer her.

## 2013-11-30 NOTE — Telephone Encounter (Signed)
Patient called for result of recent Prolactin test.  States having headaches and wanted to get result.

## 2013-11-30 NOTE — Telephone Encounter (Signed)
Prolactin level was normal.

## 2013-11-30 NOTE — Telephone Encounter (Signed)
You can offer her doctor Lewitt (neurologist) telephone number

## 2014-02-15 ENCOUNTER — Encounter: Payer: Self-pay | Admitting: Gynecology

## 2014-03-25 ENCOUNTER — Ambulatory Visit (INDEPENDENT_AMBULATORY_CARE_PROVIDER_SITE_OTHER): Payer: BC Managed Care – PPO | Admitting: Physician Assistant

## 2014-03-25 VITALS — BP 110/74 | HR 76 | Temp 98.5°F | Resp 16 | Ht 63.5 in | Wt 146.2 lb

## 2014-03-25 DIAGNOSIS — J069 Acute upper respiratory infection, unspecified: Secondary | ICD-10-CM

## 2014-03-25 MED ORDER — GUAIFENESIN ER 1200 MG PO TB12: 1.0000 | ORAL_TABLET | Freq: Two times a day (BID) | ORAL | Status: DC | PRN

## 2014-03-25 MED ORDER — MAGIC MOUTHWASH W/LIDOCAINE: 10.0000 mL | ORAL | Status: DC | PRN

## 2014-03-25 MED ORDER — IPRATROPIUM BROMIDE 0.03 % NA SOLN: 2.0000 | Freq: Two times a day (BID) | NASAL | Status: DC

## 2014-03-25 NOTE — Patient Instructions (Signed)
Use mucinex and atrovent twice a day. May use mouthwash every 2-3 hours for throat pain. Don't swallow this. Return in 5-7 days if your symptoms are not improving. Drink plenty of water and get plenty of rest!

## 2014-03-25 NOTE — Progress Notes (Signed)
   Subjective:    Patient ID: Kimberly Mccann, female    DOB: 02/20/1971, 43 y.o.   MRN: [id number]  HPI  This is a 43 year old female presenting with sore throat, nasal congestion and and left otalgia x 4 days. She had some chills but no fever. She denies cough, sinus pressure, SOB or wheezing. She hasn't tried anything. She does not have any sick contacts. She has no history of asthma and she is not a smoker.  Review of Systems  Constitutional: Positive for chills. Negative for fever.  HENT: Positive for congestion and sore throat. Negative for ear pain and sinus pressure.   Eyes: Negative for redness.  Respiratory: Negative for cough, shortness of breath and wheezing.   Gastrointestinal: Negative for nausea, abdominal pain and diarrhea.  Skin: Negative for rash.  Allergic/Immunologic: Negative for environmental allergies.  Neurological: Negative for light-headedness.  Psychiatric/Behavioral: Negative for sleep disturbance.    Patient Active Problem List   Diagnosis Date Noted  . History of hyperprolactinemia 11/11/2013  . Hair loss 11/11/2013  . History of cervical dysplasia 11/11/2013   Prior to Admission medications   Not on File   No Known Allergies  Patient's social and family history were reviewed.     Objective:   Physical Exam  Constitutional: She is oriented to person, place, and time. She appears well-developed and well-nourished. No distress.  HENT:  Head: Normocephalic and atraumatic.  Right Ear: Hearing, tympanic membrane, external ear and ear canal normal.  Left Ear: Hearing, tympanic membrane, external ear and ear canal normal.  Nose: Nose normal.  Mouth/Throat: Uvula is midline and mucous membranes are normal. Posterior oropharyngeal erythema present. No oropharyngeal exudate or posterior oropharyngeal edema.  Eyes: Conjunctivae and lids are normal. Right eye exhibits no discharge. Left eye exhibits no discharge. No scleral icterus.  Neck: Trachea normal.    Cardiovascular: Normal rate, regular rhythm, normal heart sounds, intact distal pulses and normal pulses.   No murmur heard. Pulmonary/Chest: Effort normal and breath sounds normal. No respiratory distress. She has no wheezes. She has no rhonchi. She has no rales.  Musculoskeletal: Normal range of motion.  Lymphadenopathy:    She has no cervical adenopathy.  Neurological: She is alert and oriented to person, place, and time.  Skin: Skin is warm, dry and intact. No lesion and no rash noted.  Psychiatric: She has a normal mood and affect. Her speech is normal and behavior is normal. Thought content normal.      Assessment & Plan:  1. Viral URI This is likely viral. Focus is on supportive care, see medications prescribed below. She will return in 5-7 days if symptoms are not improving.  - ipratropium (ATROVENT) 0.03 % nasal spray; Place 2 sprays into both nostrils 2 (two) times daily.  Dispense: 30 mL; Refill: 0 - Guaifenesin (MUCINEX MAXIMUM STRENGTH) 1200 MG TB12; Take 1 tablet (1,200 mg total) by mouth every 12 (twelve) hours as needed.  Dispense: 14 tablet; Refill: 1 - Alum & Mag Hydroxide-Simeth (MAGIC MOUTHWASH W/LIDOCAINE) SOLN; Take 10 mLs by mouth every 2 (two) hours as needed for mouth pain.  Dispense: 360 mL; Refill: 0   Kimberly Mccann V. Dyke BrackettBush, PA-C, [facility]  03/25/2014

## 2014-05-26 ENCOUNTER — Encounter: Payer: Self-pay | Admitting: Gynecology

## 2014-05-26 ENCOUNTER — Ambulatory Visit (INDEPENDENT_AMBULATORY_CARE_PROVIDER_SITE_OTHER): Payer: 59 | Admitting: Gynecology

## 2014-05-26 VITALS — BP 122/80

## 2014-05-26 DIAGNOSIS — Z113 Encounter for screening for infections with a predominantly sexual mode of transmission: Secondary | ICD-10-CM

## 2014-05-26 DIAGNOSIS — N898 Other specified noninflammatory disorders of vagina: Secondary | ICD-10-CM

## 2014-05-26 DIAGNOSIS — L298 Other pruritus: Secondary | ICD-10-CM

## 2014-05-26 DIAGNOSIS — N760 Acute vaginitis: Secondary | ICD-10-CM

## 2014-05-26 LAB — WET PREP FOR TRICH, YEAST, CLUE
CLUE CELLS WET PREP: NONE SEEN
Trich, Wet Prep: NONE SEEN

## 2014-05-26 MED ORDER — FLUCONAZOLE 150 MG PO TABS: ORAL_TABLET | ORAL | Status: DC

## 2014-05-26 NOTE — Progress Notes (Signed)
   Patient presented to the office today stating that for 2 weeks she was having a vaginal discharge white in appearance with some pruritus. She month 3 day Monistat but feels that her symptoms have not resolved. Patient is in a monogamous relationship. She has been using spermicidal gel for contraception. She reports her last menstrual cycle approximate 3 to have weeks ago. Patient interested in returning back during her menses to have the ParaGard IUD placed. Patient reported for the past few days she's felt some slight twinges in the left lower abdomen but not painful.  Exam: Abdomen: Soft nontender no rebound or guarding Pelvic: Bartholin urethra Skene was within normal limits Vagina: No lesions, spermicidal gel present. Cervix: No lesions or discharge Uterus: Anteverted normal size shape and consistency Adnexa: No palpable masses or tenderness Rectal exam: Not done  GC and Chlamydia culture obtained  Wet prep rare yeast  Assessment/plan: Residual yeast vaginitis is evident. She'll be prescribed Diflucan 150 mg today and if no improvement she may take a second tablet in 72 hours. GC and chlamydia culture results pending at time of this dictation. She will return back to the office with the onset of her next cycle to place the ParaGard T380A IUD which she has used in the past. Literature information was provided in [place].

## 2014-05-26 NOTE — Patient Instructions (Signed)
Fluconazole tablets Qu es este medicamento? El FLUCONAZOL es un medicamento antimictico. Se utiliza para tratar ciertos tipos de infecciones micticas o por levadura. Este medicamento puede ser utilizado para otros usos; si tiene alguna pregunta consulte con su proveedor de atencin mdica o con su farmacutico. MARCAS COMERCIALES DISPONIBLES: Diflucan Qu le debo informar a mi profesional de la salud antes de tomar este medicamento? Necesita saber si usted presenta alguno de los siguientes problemas o situaciones: -antecedentes de pulso cardaco irregular -enfermedad renal -una reaccin alrgica o inusual al fluconazol, a otros azoles u otros medicamentos, alimentos, colorantes o conservantes -si est embarazada o buscando quedar embarazada -si est amamantando a un beb Cmo debo utilizar este medicamento? Tome este medicamento por va oral. Siga las instrucciones de la etiqueta del [place]. No tome su medicamento con una frecuencia mayor a la indicada. Hable con su pediatra para informarse acerca del uso de este medicamento en nios. Puede requerir atencin especial. Este medicamento ha sido recetado a nios tan menores como de 6 meses de [place]. Sobredosis: Pngase en contacto inmediatamente con un centro toxicolgico o una sala de urgencia si usted cree que haya tomado demasiado medicamento. ATENCIN: [employer] es solo para usted. No comparta este medicamento con nadie. Qu sucede si me olvido de una dosis? Si olvida una dosis, tmela lo antes posible. Si es casi la hora de la prxima dosis, tome slo esa dosis. No tome dosis adicionales o dobles. Qu puede interactuar con este medicamento? No tome esta medicina con ninguno de los siguientes medicamentos: -astemizol -ciertos medicamentos para el pulso cardiaco irregular, tales como dofetilida, dronedarona, quinidina -cisapride -eritromicina -lomitapida -otros medicamentos que prolongan el intervalo QT (causa un ritmo  cardiaco anormal) -pimozida -terfenadina -tioridazina -tolvaptn -ziprasidona Esta medicina tambin puede interactuar con los siguientes medicamentos: -medicamentos antivirales para el VIH o SIDA -pldoras anticonceptivas -ciertos antibiticos, tales como rifabutina, rifampicina -ciertos medicamentos para la presin sangunea, tales como amlodipina, isradipina, felodipino, hidroclorotiazida, losartn, nifedipina -ciertos medicamentos para el cncer, tales como ciclofosfamida, vinblastina, vincristina -ciertos medicamentos para el colesterol, tales como atorvastatina, lovastatina, fluvastatina, simvastatina -ciertos medicamentos para la depresin, ansiedad o trastornos psicticos, tales como amitriptilina, midazolam, nortriptilina, triazolam -ciertos medicamentos para la diabetes, tales como glipizida, gliburida, tolbutamida -ciertos medicamentos para Conservation officer, historic buildings, tales como alfentanilo, fentanilo, metadona -ciertos medicamentos para convulsiones, tales como carbamazepina, fenitona -ciertos medicamentos que tratan o previenen cogulos sanguneos, tales como warfarina -halofantrina -medicamentos que reducen su capacidad de combatir infecciones, tales como ciclosporina, prednisona, tacrolimo -los AINE, medicamentos para el dolor o inflamacin, tales como celecoxib, diclofenaco, flurbiprofeno, ibuprofeno, meloxicam, naproxeno -otros medicamentos para infecciones micticas -sirolims -teofilina -tofacitinib Puede ser que esta lista no menciona todas las posibles interacciones. Informe a su profesional de [employer]	[place] de [employer] productos a base de hierbas, medicamentos de [place] o suplementos nutritivos que est tomando. Si usted fuma, consume bebidas alcohlicas o si utiliza drogas ilegales, indqueselo tambin a su profesional de KB Home	[place]. Algunas sustancias pueden interactuar con su medicamento. A qu debo estar atento al usar [employer]? Visite a su mdico o a su profesional de la  salud para chequear su evolucin peridicamente. Si toma este medicamento durante un perodo de [employer], podr Customer service manager anlisis de [place]. Si los sntomas no mejoran, consulte a su mdico. Pueden transcurrir [employer] o meses de tratamiento antes de que algunas infecciones micticas se curen. El alcohol puede aumentar la posibilidad de daos al hgado. Evite consumir bebidas alcohlicas. Si tiene una infeccin vaginal, no tenga relaciones  sexuales hasta que haya completado el tratamiento. Puede usar una toallita higinica. No use tampones. Use ropa interior de algodn no sintticos, y recin lavadas. Qu efectos secundarios puedo tener al [employer] este medicamento? Efectos secundarios que debe informar a su mdico o a Producer, television/film/video de la salud tan pronto como sea posible: -Therapist, art como erupcin cutnea, picazn o urticarias, hinchazn de los labios, boca, lengua o garganta -orina de color amarillo oscuro -sensacin de mareos o desmayos -pulso cardiaco irregular o dolor en el pecho -enrojecimiento, formacin de ampollas, descamacin o distensin de la piel, inclusive dentro de la boca -dificultad al respirar -sangrado o magulladuras inusuales -vmito -color amarillento de los ojos o la piel Efectos secundarios que, por lo general, no requieren Psychologist, prison and probation services (debe informarlos a su mdico o a Producer, television/film/video de la salud si persisten o si son molestos): -cambios en el sabor de los alimentos -diarrea -dolor de cabeza -Programme researcher, broadcasting/film/video o nuseas Puede ser que esta lista no menciona todos los posibles efectos secundarios. Comunquese a su mdico por asesoramiento mdico [employer]. Usted puede informar los efectos secundarios a la FDA por telfono al 1-800-FDA-1088. Dnde debo guardar mi medicina? Mantngala fuera del alcance de los nios. Gurdela a Sanmina-SCI, a menos de 30 grados C (86 grados F). Deseche todo el medicamento  que no haya utilizado, despus de la fecha de vencimiento. ATENCIN: Este folleto es un resumen. Puede ser que no cubra toda la posible informacin. Si usted tiene preguntas acerca de esta medicina, consulte con su mdico, su farmacutico o su profesional de Radiographer, therapeutic.  2015, Elsevier/Gold Standard. (2012-12-25 14:13:08) Informacin sobre el dispositivo intrauterino (Intrauterine Device Information) Un dispositivo intrauterino (DIU) se inserta en el tero e impide el embarazo. Hay dos tipos de DIU:   DIU de cobre: este tipo de DIU est recubierto con un alambre de cobre y se inserta dentro del tero. El cobre hace que el tero y las trompas de [place] produzcan un liquido que [employer] espermatozoides. El DIU de cobre puede Geneticist, molecular durante 10 aos.  DIU con hormona: este tipo de DIU contiene la hormona progestina (progesterona sinttica). Las hormonas hacen que el moco cervical se haga ms espeso, lo que evita que el esperma ingrese al tero. Tambin hace que la membrana que recubre internamente al tero sea ms delgada lo que impide el implante del vulo fertilizado. La hormona debilita o destruye los espermatozoides que ingresan al tero. Alguno de los tipos de DIU hormonal pueden Geneticist, molecular durante 5 aos y otros tipos pueden dejarse en el lugar por 3 aos. El mdico se asegurar de que usted sea una buena candidata para usar el DIU. Converse con su mdico acerca de los posibles efectos secundarios.  VENTAJAS DEL DISPOSITIVO INTRAUTERINO  El DIU es muy eficaz, reversible, de accin prolongada y de bajo mantenimiento.  No hay efectos secundarios relacionados con el estrgeno.  El DIU puede ser utilizado durante la Market researcher.  No est asociado con el aumento de [place].  Funciona inmediatamente despus de la insercin.  El DIU hormonal funciona inmediatamente si se inserta dentro de los [address] del inicio del perodo. Ser necesario que utilice un mtodo  anticonceptivo adicional durante 7 das si el DIU hormonal se inserta en algn otro momento del ciclo.  El DIU de cobre no interfiere con las hormonas femeninas.  El DIU hormonal puede hacer que los perodos menstruales abundantes se hagan ms ligeros y que haya menos clicos.  El DIU hormonal puede usarse durante 3 a 5 aos.  El DIU de cobre puede usarse durante 10 aos. DESVENTAJAS DEL DISPOSITIVO INTRAUTERINO  El DIU hormonal puede estar asociado con patrones de sangrado irregular.  El DIU de cobre puede hacer que el flujo menstrual ms abundante y doloroso.  Puede experimentar clicos y sangrado vaginal despus de la insercin. Document Released: 09/20/2009 Document Revised: 12/03/2012 [facility] Patient Information 2015 [place], [place]. This information is not intended to replace advice given to you by your health care provider. Make sure you discuss any questions you have with your health care provider. Vaginitis monilisica (Monilial Vaginitis) La vaginitis es una inflamacin (irritacin, hinchazn) de la vagina y la vulva. Esta no es una enfermedad de transmisin sexual.  CAUSAS Este tipo de vaginitis lo causa un hongo (candida) que normalmente se encuentra en la vagina. El hongo candida se ha desarrollado hasta el punto de ocasionar problemas en el equilibrio qumico. SNTOMAS  Secrecin vaginal espesa y blanca.  Hinchazn, picazn, enrojecimiento e inflamacin de la vagina y en algunos casos de los labios vaginales (vulva).  Ardor o dolor al ConocoPhillipsorinar.  Dolor en las relaciones sexuales. DIAGNSTICO Los factores que favorecen la vaginitis moniliasica son:  Everlean Pattersontapas de virginidad y postmenopusicas.  Embarazo.  Infecciones.  Sentir cansancio, estar enferma o estresada, especialmente si ya ha sufrido este problema en el pasado.  Diabetes Buen control ayudar a disminur la probabilidad.  Pldoras anticonceptivas  Ropa interior [place] Islandsmuy ajustada.  El uso de espumas de bao,  aerosoles femeninos duchas vaginales o tampones con desodorante.  Algunos antibiticos (medicamentos que destruyen grmenes).  Si contrae alguna enfermedad puede sufrir recurrencias espordicas. TRATAMIENTO El profesional que lo asiste prescribir medicamentos.  Hay diferentes tipos de cremas y supositorios vaginales que tratan especficamente la vaginitis monilisica. Para infecciones por hongos recurrentes, utilice un supositorio o crema en la vagina dos veces por semana, o segn se le indique.  Tambin podrn utilizarse cremas con corticoides o anti monilisicas para la picazn o la irritacin de la vulva. Consulte con el profesional que la asiste.  Si la crema no da resultado, podr aplicarse en la vagina una solucin con azul de metileno.  El consumo de yogur puede prevenir este tipo de vaginitis. INSTRUCCIONES PARA EL CUIDADO DOMICILIARIO  Tome todos los medicamentos tal como se le indic.  No mantenga relaciones sexuales hasta que el tratamiento se haya completado, o segn las indicaciones del profesional que la asiste.  Tome baos de asiento tibios.  No se aplique duchas vaginales.  No utilice tampones, especialmente los perfumados.  Use ropa interior de algodn  MirantEvite los pantalones ajustados y las medias tipo panty.  Comunique a sus compaeros sexuales que sufre una infeccin por hongos. Ellos deben concurrir para un control mdico si tienen sntomas como una urticaria leve o picazn.  Sus compaeros sexuales deben tratarse tambin si la infeccin es difcil de Pharmacologisteliminar.  Practique el sexo seguro - use condones  Algunos medicamentos vaginales ocasionan fallas en los condones de ltex. Los medicamentos vaginales que pueden daar los condones son:  ChiropodistCrema cleocina  Butoconazole (Femstat)  Terconazole (Terazol) supositorios vaginales  Miconazole (Monistat) (es un medicamento de venta libre) SOLICITE ATENCIN MDICA SI:  Daphane ShepherdUsted tiene una temperatura oral de ms  de 38,9 C (102 F).  Si la infeccin empeora luego de 2 [address] de tratamiento.  Si la infeccin no mejora luego de 3 [address] de tratamiento.  Aparecen ampollas en o alrededor de la vagina.  Si aparece Neomia Dearuna hemorragia  vaginal y no es el momento del perodo.  Siente dolor al [employer]oco[employer]illips.  Presenta problemas intestinales.  Tiene dolor durante las [employer]. Document Released: 01/10/2005 Document Revised: 06/25/2011 [facility] Patient Information 2015 [place], [place]. This information is not intended to replace advice given to you by your health care provider. Make sure you discuss any questions you have with your health care provider.

## 2014-05-27 ENCOUNTER — Telehealth: Payer: Self-pay | Admitting: Gynecology

## 2014-05-27 LAB — GC/CHLAMYDIA PROBE AMP
CT PROBE, AMP APTIMA: NEGATIVE
GC Probe RNA: NEGATIVE

## 2014-05-27 NOTE — Telephone Encounter (Signed)
05/27/14-Pt was advised today that [facility] will cover the Paraguard IUD & insertion at 100% for contraception, no copay. She will discuss it with her partner and let us know if she wants to proceed. She was told it needs to be inserted while on cycle.wl

## 2014-08-15 HISTORY — PX: INTRAUTERINE DEVICE INSERTION: SHX323

## 2014-08-18 ENCOUNTER — Encounter: Payer: Self-pay | Admitting: Gynecology

## 2014-08-18 ENCOUNTER — Ambulatory Visit (INDEPENDENT_AMBULATORY_CARE_PROVIDER_SITE_OTHER): Payer: 59 | Admitting: Gynecology

## 2014-08-18 VITALS — BP 142/80 | Ht 63.0 in | Wt 146.0 lb

## 2014-08-18 DIAGNOSIS — Z30430 Encounter for insertion of intrauterine contraceptive device: Secondary | ICD-10-CM

## 2014-08-18 DIAGNOSIS — N3281 Overactive bladder: Secondary | ICD-10-CM | POA: Diagnosis not present

## 2014-08-18 DIAGNOSIS — Z975 Presence of (intrauterine) contraceptive device: Secondary | ICD-10-CM | POA: Insufficient documentation

## 2014-08-18 MED ORDER — TOLTERODINE TARTRATE ER 2 MG PO CP24: 2.0000 mg | ORAL_CAPSULE | Freq: Every day | ORAL | Status: DC

## 2014-08-18 NOTE — Patient Instructions (Signed)
Tolterodine extended-release capsules Qu es este medicamento? La TOLTERODINA se utiliza para tratar la vejiga hiperactiva. Este medicamento puede reducir la necesidad de Geographical information systems officerorinar con frecuencia. Tambin puede ayudar a evitar que una persona se orine accidentalmente. Este medicamento puede ser utilizado para otros usos; si tiene alguna pregunta consulte con su proveedor de atencin mdica o con su farmacutico. MARCAS COMERCIALES DISPONIBLES: Detrol LA Qu le debo informar a mi profesional de la salud antes de tomar este medicamento? Necesita saber si usted presenta alguno de los siguientes problemas o situaciones: -dificultad para Geographical information systems officerorinar -glaucoma -obstruccin intestinal -pulso cardiaco irregular o tiene un miembro de su familia que tiene pulso cardiaco irregular -enfermedad renal -enfermedad heptica -miastenia gravis -una reaccin alrgica o inusual a la tolterodina, fesoterodina, a otros medicamentos, alimentos, colorantes o conservantes -si est embarazada o buscando quedar embarazada -si est amamantando a un beb Cmo debo utilizar este medicamento? Tome este medicamento por va oral con un vaso de agua. Ingiralo entero, no lo triture, corte ni mastique. Siga las instrucciones de la etiqueta del [place]medic[place]. Tome sus dosis a intervalos regulares. No tome su medicamento con una frecuencia mayor a la indicada. Hable con su pediatra para informarse acerca del uso de este medicamento en nios. Puede requerir atencin especial. Sobredosis: Pngase en contacto inmediatamente con un centro toxicolgico o una sala de urgencia si usted cree que haya tomado demasiado medicamento. ATENCIN: Reynolds AmericanEste medicamento es solo para usted. No comparta este medicamento con nadie. Qu sucede si me olvido de una dosis? Si olvida una dosis, tmela lo antes posible. Si es casi la hora de la prxima dosis, tome slo esa dosis. No tome dosis adicionales o dobles. Qu puede interactuar con este  medicamento? -claritromicina -ciclosporina -eritromicina -fluoxetina -medicamentos para infecciones micticas, como fluconazol, itraconazol, quetoconazol o voriconazol -medicamentos para problemas de memoria, como galantamina, donepezil, tacrina -vinblastina Puede ser que esta lista no menciona todas las posibles interacciones. Informe a su profesional de Beazer Homesla salud de [employer] Inctodos los productos a base de hierbas, medicamentos de [place]venta libre o suplementos nutritivos que est tomando. Si usted fuma, consume bebidas alcohlicas o si utiliza drogas ilegales, indqueselo tambin a su profesional de Beazer Homesla salud. Algunas sustancias pueden interactuar con su medicamento. A qu debo estar atento al usar PPL Corporationeste medicamento? Pueden transcurrir 2  3 meses antes de que obtenga el beneficio mximo de PPL Corporationeste medicamento. Su profesional de Music therapistla salud tambin le puede recomendar algunas tcnicas que pueden ayudar a Scientist, clinical (histocompatibility and immunogenetics)mejorar el control de su vejiga y de los msculos del Corporate investment bankeresfnter. Estas tcnicas le ayudarn a reducir la frecuencia con que debe ir al bao. Es posible que sea necesario limitar la ingesta de t, caf, refrescos con cafena y alcohol. Estas bebidas pueden empeorar los sntomas. Si sigue hbitos saludables para los intestinos, los sntomas de la vejiga pueden disminuir. Si fuma, deje de hacerlo; de OGE Energyesta manera ayudar a reducir la irritacin de los msculos de la vejiga. Puede experimentar mareos o somnolencia. No conduzca ni utilice maquinaria, ni haga nada que Scientist, research (life sciences)le exija permanecer en estado de alerta hasta que sepa cmo le afecta este medicamento. No se siente ni se ponga de pie con rapidez, especialmente si es un paciente de edad avanzada. Esto ayuda a Software engineerreducir el riesgo de mareos o Dolanddesmayos. Se le podr secar la boca. Masticar chicle sin azcar, chupar caramelos duros y beber agua en abundancia le ayudar a mantener la boca hmeda. Este medicamento puede resecarle los ojos y provocar visin borrosa. Si [place]ausa lentes de  contacto, puede sentir ciertas  molestias. Las gotas lubricantes pueden ser tiles. Si el problema no desaparece o es severo, consulte a su mdico de los ojos. Qu efectos secundarios puedo tener al Boston Scientificutilizar este medicamento? Efectos secundarios que debe informar a su mdico o a Producer, television/film/videosu profesional de la salud tan pronto como sea posible: -Therapist, artreacciones alrgicas como erupcin cutnea, picazn o urticarias, hinchazn de la cara, labios o lengua -problemas respiratorios -confusin -dificultad para orinar -pulso cardaco rpido, irregular -alucinaciones -problemas con la memoria -hinchazn de pies, manos Efectos secundarios que, por lo general, no requieren atencin mdica (debe informarlos a su mdico o a su profesional de la salud si persisten o si son molestos): -cambios en la visin -estreimiento -boca, ojos secos -dolor de cabeza -Richrd Soxmareo, somnolencia -Programme researcher, broadcasting/film/videomalestar estomacal Puede ser que esta lista no menciona todos los posibles efectos secundarios. Comunquese a su mdico por asesoramiento mdico Hewlett-Packardsobre los efectos secundarios. Usted puede informar los efectos secundarios a la FDA por telfono al 1-800-FDA-1088. Dnde debo guardar mi medicina? Mantngala fuera del alcance de los nios. Gurdela a Sanmina-SCItemperatura ambiente, entre 15 y 30 grados C ([id number] y 5386 grados F). Protjala de luz. Deseche los medicamentos que no haya utilizado, despus de la fecha de vencimiento. ATENCIN: Este folleto es un resumen. Puede ser que no cubra toda la posible informacin. Si usted tiene preguntas acerca de esta medicina, consulte con su mdico, su farmacutico o su profesional de Radiographer, therapeuticla salud.  2015, Elsevier/Gold Standard. (2010-02-08 14:09:16)

## 2014-08-18 NOTE — Progress Notes (Signed)
   Patient presented to the office today for placement of ParaGard T380A IUD for contraception. Patient had received literature information in the past. She has used this form of contraception in the past as well as a Mirena IUD. She would prefer to use a nonhormonal form of contraception. Patient reports 3 day normal menstrual cycles. Patient also brought up to my attention that she gets up to urinate 3-4 times a day to and during the daytime she feels that she goes frequently and feels that she has not emptied completely. Very minimal if any stress urinary incontinence if her bladder is full she does not get to the bathroom in time.                                                                    IUD procedure note       Patient presented to the office today for placement of ParaGard IUD. The patient had previously been provided with literature information on this method of contraception. The risks benefits and pros and cons were discussed and all her questions were answered. She is fully aware that this form of contraception is 99% effective and is good for 10 years.  Pelvic exam: Bartholin urethra Skene glands: Within normal limits Vagina: No lesions or discharge Cervix: No lesions or discharge Uterus: Anteverted position Adnexa: No masses or tenderness Rectal exam: Not done  The cervix was cleansed with Betadine solution. A single-tooth tenaculum was placed on the anterior cervical lip. The uterus sounded to 7 centimeter. The IUD was shown to the patient and inserted in a sterile fashion. The IUD string was trimmed. The single-tooth tenaculum was removed. Patient was instructed to return back to the office in one month for follow up.       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 For this reason patient will be prescribed Detrol LA 2 mg to take by mouth daily. Patient denies any history of closed-angle glaucoma. The risks benefits  and pros and cons of the medication were discussed in detail as well as literature information was provided.

## 2014-08-23 ENCOUNTER — Telehealth: Payer: Self-pay | Admitting: *Deleted

## 2014-08-23 ENCOUNTER — Encounter: Payer: Self-pay | Admitting: Gynecology

## 2014-08-23 MED ORDER — FESOTERODINE FUMARATE ER 4 MG PO TB24: 4.0000 mg | ORAL_TABLET | Freq: Every day | ORAL | Status: DC

## 2014-08-23 NOTE — Telephone Encounter (Signed)
Rx sent 

## 2014-08-23 NOTE — Telephone Encounter (Signed)
Toviaz 4 mg by mouth daily #30 with 11 refills

## 2014-08-23 NOTE — Telephone Encounter (Signed)
Rx was sent to pharmacy for Detrol LA 2 mg, pharmacist said this medication is not on formulary list as coved drug. Can it be changed to oxybutynin or Gala Murdochoviaz which is on formulary list? Please advise

## 2014-08-26 ENCOUNTER — Ambulatory Visit
Admission: RE | Admit: 2014-08-26 | Discharge: 2014-08-26 | Disposition: A | Discharge status: Home / Self Care | Payer: 59 | Source: Ambulatory Visit

## 2014-08-26 ENCOUNTER — Other Ambulatory Visit: Payer: Self-pay

## 2014-08-26 DIAGNOSIS — Z1231 Encounter for screening mammogram for malignant neoplasm of breast: Secondary | ICD-10-CM

## 2014-09-23 ENCOUNTER — Ambulatory Visit: Payer: 59 | Admitting: Gynecology

## 2014-09-24 ENCOUNTER — Encounter: Payer: Self-pay | Admitting: Gynecology

## 2014-09-24 ENCOUNTER — Ambulatory Visit (INDEPENDENT_AMBULATORY_CARE_PROVIDER_SITE_OTHER): Payer: 59 | Admitting: Gynecology

## 2014-09-24 VITALS — BP 115/80 | Ht 63.0 in | Wt 155.0 lb

## 2014-09-24 DIAGNOSIS — Z30431 Encounter for routine checking of intrauterine contraceptive device: Secondary | ICD-10-CM | POA: Diagnosis not present

## 2014-09-24 DIAGNOSIS — N3281 Overactive bladder: Secondary | ICD-10-CM | POA: Diagnosis not present

## 2014-09-24 MED ORDER — MIRABEGRON ER 25 MG PO TB24: 25.0000 mg | ORAL_TABLET | Freq: Every day | ORAL | Status: DC

## 2014-09-24 NOTE — Patient Instructions (Signed)
Mirabegron extended-release tablets Qu es este medicamento? El MIRABEGRON se utiliza para tratar la vejiga hiperactiva. Este medicamento reduce la necesidad de orinar con frecuencia. Tambin puede ayudar a evitar que una persona se orine accidentalmente. Este medicamento puede ser utilizado para otros usos; si tiene alguna pregunta consulte con su proveedor de atencin mdica o con su farmacutico. MARCAS COMERCIALES DISPONIBLES: Myrbetriq Qu le debo informar a mi profesional de la salud antes de tomar este medicamento? Necesita saber si usted presenta alguno de los siguientes problemas o situaciones: -dificultad para orinar -alta presin sangunea -enfermedad renal -enfermedad heptica -una reaccin alrgica o inusual al mirabegron, a otros medicamentos, alimentos, colorantes o conservantes -si est embarazada o buscando quedar embarazada -si est amamantando a un beb Cmo debo utilizar este medicamento? Tome este medicamento por va oral con un vaso de agua. Siga las instrucciones de la etiqueta del medicamento. No corte, triture ni mastique este medicamento. Usted puede tomar este medicamento con o sin alimentos. Si le produce malestar estomacal, tmelo con alimentos. Tome su medicamento a intervalos regulares. No tome su medicamento con una frecuencia mayor a la indicada. No deje de tomarlo excepto si as lo indica su mdico. Hable con su pediatra para informarse acerca del uso de este medicamento en nios. Puede requerir atencin especial. Sobredosis: Pngase en contacto inmediatamente con un centro toxicolgico o una sala de urgencia si usted cree que haya tomado demasiado medicamento. ATENCIN: Este medicamento es solo para usted. No comparta este medicamento con nadie. Qu sucede si me olvido de una dosis? Si se olvida una dosis, tmela lo antes posible. Si es casi la hora de la prxima dosis, tome slo esa dosis. No tome dosis adicionales o dobles. Qu puede interactuar con este  medicamento? -ciertos medicamentos para problemas de vejiga, tales como fesoterodina, oxibutinina, solifenacina, tolterodina -desipramina -digoxina -flecainida -quetoconazol -IMAOs, tales como Carbex, Eldepryl, Marplan, Nardil y Parnate -metoprolol -propafenona -tioridazina -warfarina Puede ser que esta lista no menciona todas las posibles interacciones. Informe a su profesional de la salud de todos los productos a base de hierbas, medicamentos de venta libre o suplementos nutritivos que est tomando. Si usted fuma, consume bebidas alcohlicas o si utiliza drogas ilegales, indqueselo tambin a su profesional de la salud. Algunas sustancias pueden interactuar con su medicamento. A qu debo estar atento al usar este medicamento? Pueden transcurrir 8 semanas antes de que obtenga el beneficio mximo de este medicamento. Es posible que sea necesario limitar la ingesta de t, caf, refrescos con cafena y alcohol. Estas bebidas pueden empeorar los sntomas. Visite a su mdico o a su profesional de la salud para chequear su evolucin peridicamente. Controle su presin sangunea como se le haya indicado. Pregunte a su mdico o a su profesional de la salud cul debe ser su presin sangunea y cundo deber comunicarse con l o ella. Qu efectos secundarios puedo tener al utilizar este medicamento? Efectos secundarios que debe informar a su mdico o a su profesional de la salud tan pronto como sea posible: -reacciones alrgicas como erupcin cutnea, picazn o urticarias, hinchazn de la cara, labios o lengua -dolor en el pecho o palpitaciones -dolor de cabeza severa o repentina -alta presin sangunea -pulso cardiaco irregular, rpido -enrojecimiento, formacin de ampollas, descamacin o distensin de la piel, inclusive dentro de la boca -signos de una infeccin - fiebre o escalofros, dolor o dificultad para orina -dificultad para orinar o cambios en el volumen de orina Efectos secundarios que,  por lo general, no requieren atencin mdica (debe informarlos a   su mdico o a su profesional de la salud si persisten o si son molestos): -estreimiento -ojos secos -dolor articular -dolor de cabeza leve -nuseas -goteo de la nariz Puede ser que esta lista no menciona todos los posibles efectos secundarios. Comunquese a su mdico por asesoramiento mdico sobre los efectos secundarios. Usted puede informar los efectos secundarios a la FDA por telfono al 1-800-FDA-1088. Dnde debo guardar mi medicina? Mantngala fuera del alcance de los nios. Gurdela a temperatura ambiente, entre 15 y 30 grados C (59 y 86 grados F). Deseche todo el medicamento que no haya utilizado, despus de la fecha de vencimiento. ATENCIN: Este folleto es un resumen. Puede ser que no cubra toda la posible informacin. Si usted tiene preguntas acerca de esta medicina, consulte con su mdico, su farmacutico o su profesional de la salud.  2015, [employer]. (2010-11-07 16:51:46)  

## 2014-09-24 NOTE — Progress Notes (Signed)
   Patient presented to the office today for 1 month follow-up after placing a ParaGard T380A IUD for contraception. Patient is doing well. At that office visit she was complaining of nocturia and urinary frequency but no stress urinary incontinence. She was prescribed Detrol LA 2 mg daily but stated that it was too expensive and if I could find any other alternatives.  Exam: Blood pressure 115/80 Gen. appearance: Well developed well nourished female in can distress Abdomen: Soft nontender no rebound or guarding Pelvic: Bartholin urethra Skene was within normal limits Vagina: No lesions or discharge Cervix: No lesions or discharge IUD string visualized Uterus: Anteverted normal size shape and consistency Adnexa: No palpable mass or tenderness Rectal exam not done  Assessment for/plan: Patient doing well 1 month after placement of ParaGard T380A IUD. 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 We'll see her insurance will cover. Patient otherwise scheduled to return to the office in 2-3 months for annual exam will see how she is responding to treatment.

## 2014-10-08 ENCOUNTER — Telehealth: Payer: Self-pay | Admitting: *Deleted

## 2014-10-08 NOTE — Telephone Encounter (Signed)
PA done for myrbetriq 25 mg tablets, faxed to [facility] will wait for response.

## 2014-10-15 MED ORDER — FESOTERODINE FUMARATE ER 4 MG PO TB24: 4.0000 mg | ORAL_TABLET | Freq: Every day | ORAL | Status: DC

## 2014-10-15 NOTE — Telephone Encounter (Signed)
Dr.Fernandez insurance is not going to pay for myrbetiq Rx. Pt will need to try ditropan, Oxybutynin, Toviaz. Please advise

## 2014-10-15 NOTE — Telephone Encounter (Signed)
Pt aware Rx was not covered, will send new Rx pt aware of this as well.

## 2014-10-15 NOTE — Telephone Encounter (Signed)
Please call in prescription for Toviaz 4 mg one by mouth daily # 11 refills

## 2014-11-17 ENCOUNTER — Ambulatory Visit (INDEPENDENT_AMBULATORY_CARE_PROVIDER_SITE_OTHER): Payer: 59 | Admitting: Women's Health

## 2014-11-17 ENCOUNTER — Encounter: Payer: Self-pay | Admitting: Women's Health

## 2014-11-17 VITALS — BP 126/78

## 2014-11-17 DIAGNOSIS — A499 Bacterial infection, unspecified: Secondary | ICD-10-CM | POA: Diagnosis not present

## 2014-11-17 DIAGNOSIS — B3731 Acute candidiasis of vulva and vagina: Secondary | ICD-10-CM | POA: Insufficient documentation

## 2014-11-17 DIAGNOSIS — L298 Other pruritus: Secondary | ICD-10-CM

## 2014-11-17 DIAGNOSIS — B373 Candidiasis of vulva and vagina: Secondary | ICD-10-CM

## 2014-11-17 DIAGNOSIS — B9689 Other specified bacterial agents as the cause of diseases classified elsewhere: Secondary | ICD-10-CM

## 2014-11-17 DIAGNOSIS — N760 Acute vaginitis: Secondary | ICD-10-CM | POA: Diagnosis not present

## 2014-11-17 DIAGNOSIS — R102 Pelvic and perineal pain: Secondary | ICD-10-CM

## 2014-11-17 DIAGNOSIS — N898 Other specified noninflammatory disorders of vagina: Secondary | ICD-10-CM

## 2014-11-17 LAB — URINALYSIS W MICROSCOPIC + REFLEX CULTURE
Bilirubin Urine: NEGATIVE
Casts: NONE SEEN [LPF]
Crystals: NONE SEEN [HPF]
GLUCOSE, UA: NEGATIVE
Hgb urine dipstick: NEGATIVE
KETONES UR: NEGATIVE
NITRITE: NEGATIVE
PROTEIN: NEGATIVE
SPECIFIC GRAVITY, URINE: 1.015 (ref 1.001–1.035)
Yeast: NONE SEEN [HPF]
pH: 7 (ref 5.0–8.0)

## 2014-11-17 LAB — WET PREP FOR TRICH, YEAST, CLUE: TRICH WET PREP: NONE SEEN

## 2014-11-17 MED ORDER — FLUCONAZOLE 150 MG PO TABS: 150.0000 mg | ORAL_TABLET | Freq: Once | ORAL | Status: DC

## 2014-11-17 MED ORDER — METRONIDAZOLE 500 MG PO TABS: 500.0000 mg | ORAL_TABLET | Freq: Two times a day (BID) | ORAL | Status: DC

## 2014-11-17 NOTE — Patient Instructions (Signed)
Vaginitis monilisica (Monilial Vaginitis) La vaginitis es una inflamacin (irritacin, hinchazn) de la vagina y la vulva. Esta no es una enfermedad de transmisin sexual.  CAUSAS Este tipo de vaginitis lo causa un hongo (candida) que normalmente se encuentra en la vagina. El hongo candida se ha desarrollado hasta el punto de ocasionar problemas en el equilibrio qumico. SNTOMAS  Secrecin vaginal espesa y blanca.  Hinchazn, picazn, enrojecimiento e inflamacin de la vagina y en algunos casos de los labios vaginales (vulva).  Ardor o dolor al [employer].  Dolor en las relaciones sexuales. DIAGNSTICO Los factores que favorecen la vaginitis moniliasica son:  Everlean Patterson de virginidad y postmenopusicas.  Embarazo.  Infecciones.  Sentir cansancio, estar enferma o estresada, especialmente si ya ha sufrido este problema en el pasado.  Diabetes Buen control ayudar a disminur la probabilidad.  Pldoras anticonceptivas  Ropa interior [place].  El uso de espumas de bao, aerosoles femeninos duchas vaginales o tampones con desodorante.  Algunos antibiticos (medicamentos que destruyen grmenes).  Si contrae alguna enfermedad puede sufrir recurrencias espordicas. TRATAMIENTO El profesional que lo asiste prescribir medicamentos.  Hay diferentes tipos de cremas y supositorios vaginales que tratan especficamente la vaginitis monilisica. Para infecciones por hongos recurrentes, utilice un supositorio o crema en la vagina dos veces por semana, o segn se le indique.  Tambin podrn utilizarse cremas con corticoides o anti monilisicas para la picazn o la irritacin de la vulva. Consulte con el profesional que la asiste.  Si la crema no da resultado, podr aplicarse en la vagina una solucin con azul de metileno.  El consumo de yogur puede prevenir este tipo de vaginitis. INSTRUCCIONES PARA EL CUIDADO DOMICILIARIO  Tome todos los medicamentos tal como se le indic.  No  mantenga relaciones sexuales hasta que el tratamiento se haya completado, o segn las indicaciones del profesional que la asiste.  Tome baos de asiento tibios.  No se aplique duchas vaginales.  No utilice tampones, especialmente los perfumados.  Use ropa interior de algodn  Mirant pantalones ajustados y las medias tipo panty.  Comunique a sus compaeros sexuales que sufre una infeccin por hongos. Ellos deben concurrir para un control mdico si tienen sntomas como una urticaria leve o picazn.  Sus compaeros sexuales deben tratarse tambin si la infeccin es difcil de Pharmacologist.  Practique el sexo seguro - use condones  Algunos medicamentos vaginales ocasionan fallas en los condones de ltex. Los medicamentos vaginales que pueden daar los condones son:  Chiropodist cleocina  Butoconazole (Femstat)  Terconazole (Terazol) supositorios vaginales  Miconazole (Monistat) (es un medicamento de venta libre) SOLICITE ATENCIN MDICA SI:  Daphane Shepherd tiene una temperatura oral de ms de 38,9 C (102 F).  Si la infeccin empeora luego de 2 [address]  Si la infeccin no mejora luego de 3 [address].  Aparecen ampollas en o alrededor de la vagina.  Si aparece una hemorragia vaginal y no es el momento del perodo.  Siente dolor al [employer].  Presenta problemas intestinales.  Tiene dolor durante las [employer]. Document Released: 01/10/2005 Document Revised: 06/25/2011 [facility] Patient Information 2015 [place], [place]. This information is not intended to replace advice given to you by your health care provider. Make sure you discuss any questions you have with your health care provider. Vaginosis bacteriana (Bacterial Vaginosis) La vaginosis bacteriana es una infeccin de la vagina. Se produce cuando una cantidad excesiva de ciertos grmenes (bacterias) crece en la vagina. CUIDADOS EN EL HOGAR  Tome los medicamentos tal como se lo  indic su  mdico.  Finalice la prescripcin completa, aunque comience a sentirse mejor.  No mantenga relaciones sexuales hasta que finalice sus medicamentos y se Therapist, occupational.  Comunique a sus compaeros sexuales que sufre una infeccin. Deben consultar a su mdico para iniciar un tratamiento.  Practique el sexo seguro. Use preservativos. Tenga solo un compaero sexual. SOLICITE AYUDA SI:  No mejora luego de 3 das de [place].  Observa una secrecin (prdida) de color gris ms abundante que proviene de la vagina.  Siente ms dolor que antes.  Tiene fiebre. ASEGRESE DE QUE:   Comprende estas instrucciones.  Controlar su afeccin.  Recibir ayuda de inmediato si no mejora o si empeora. Document Released: 06/29/2008 Document Revised: 01/21/2013 [employer] [facility] Patient Information 2015 [place], [place]. This information is not intended to replace advice given to you by your health care provider. Make sure you discuss any questions you have with your health care provider.

## 2014-11-17 NOTE — Progress Notes (Signed)
Patient ID: Kimberly Mccann, female   DOB: [birth year], 44 y.o.   MRN: [id number] Presents with complaint of increased vaginal discharge with vaginal burning, itching, some urinary frequency and intermittent low abdominal pain for several days. Denies fever, pain or burning at end of stream of urination. Monthly cycle ParaGard IUD.   Exam: Appears well. External genitalia within normal limits, speculum exam moderate amount of a white adherent discharge noted, wet prep positive for moderate yeast, clues, TNTC bacteria. Bimanual no CMT or adnexal tenderness, minimal discomfort with exam. IUD strings visible. UA: Leukocytes 2+, 10-20 WBCs, many bacteria, many squamous epithelials.  Yeast vaginitis Bacteria vaginosis  Plan: Requested pills, Flagyl 500 twice daily for 7 days, alcohol precautions reviewed, Diflucan 150 by mouth today repeat in 3 days if needed. Call if no relief of symptoms. Urine culture pending.

## 2014-11-19 LAB — URINE CULTURE
Colony Count: NO GROWTH
ORGANISM ID, BACTERIA: NO GROWTH

## 2014-11-29 ENCOUNTER — Encounter: Payer: Self-pay | Admitting: Gynecology

## 2014-11-29 ENCOUNTER — Ambulatory Visit (INDEPENDENT_AMBULATORY_CARE_PROVIDER_SITE_OTHER): Payer: 59 | Admitting: Gynecology

## 2014-11-29 VITALS — BP 116/70

## 2014-11-29 DIAGNOSIS — N898 Other specified noninflammatory disorders of vagina: Secondary | ICD-10-CM

## 2014-11-29 DIAGNOSIS — L298 Other pruritus: Secondary | ICD-10-CM

## 2014-11-29 DIAGNOSIS — R300 Dysuria: Secondary | ICD-10-CM | POA: Diagnosis not present

## 2014-11-29 LAB — URINALYSIS W MICROSCOPIC + REFLEX CULTURE
Bilirubin Urine: NEGATIVE
CRYSTALS: NONE SEEN [HPF]
Casts: NONE SEEN [LPF]
Glucose, UA: NEGATIVE
HGB URINE DIPSTICK: NEGATIVE
KETONES UR: NEGATIVE
Nitrite: NEGATIVE
PH: 6 (ref 5.0–8.0)
Protein, ur: NEGATIVE
Specific Gravity, Urine: 1.01 (ref 1.001–1.035)

## 2014-11-29 LAB — WET PREP FOR TRICH, YEAST, CLUE
CLUE CELLS WET PREP: NONE SEEN
TRICH WET PREP: NONE SEEN

## 2014-11-29 MED ORDER — TERCONAZOLE 0.4 % VA CREA: 1.0000 | TOPICAL_CREAM | Freq: Every day | VAGINAL | Status: DC

## 2014-11-29 MED ORDER — PHENAZOPYRIDINE HCL 200 MG PO TABS: 200.0000 mg | ORAL_TABLET | Freq: Three times a day (TID) | ORAL | Status: DC | PRN

## 2014-11-29 MED ORDER — NITROFURANTOIN MONOHYD MACRO 100 MG PO CAPS: 100.0000 mg | ORAL_CAPSULE | Freq: Two times a day (BID) | ORAL | Status: DC

## 2014-11-29 NOTE — Progress Notes (Signed)
     Patient is a 44 year old who presented to the office complaining of dysuria frequency any slight vaginal discharge for the past few days. Review of her record indicated that the first week and August she was treated for bacterial vaginosis. She has a steady partner. She has an IUD for contraception otherwise having normal menstrual cycles. She denied any fever chills or any back pain.  Exam: Blood pressure 160/70 Gen. appearance well-developed 1 or female no acute distress Abdomen: Soft nontender no rebound or guarding Pelvic: Bartholin urethra Skene was within normal limits Vagina slight thick white discharge was noted Cervix: No lesions or discharge, IUD string seen Uterus: Anteverted normal size shape and consistency Adnexa: No palpable masses or tenderness  Wet prep few yeast, many white blood cells and bacteria  Urinalysis: White blood cell 6-10, RBC 0-2, bacteria few and yeast were noted  Assessment/plan: #1 patient will be prescribed Macrobid 100 mg twice a day for 7 day with the addition of Pyridium 200 mg 3 times a day for 3 days based on her symptoms and clinical findings as we wait for her urine culture. #2 for her yeast vaginitis she will be prescribed Terazol 7 to apply daily at bedtime for one week.

## 2014-11-29 NOTE — Patient Instructions (Signed)
Terconazole vaginal cream Qu es este medicamento? [place] es un medicamento antimictico. Se utiliza para el tratamiento de infecciones vaginales causadas por levadura. Este medicamento puede ser utilizado para otros usos; si tiene alguna pregunta consulte con su proveedor de atencin mdica o con su farmacutico. MARCAS COMERCIALES DISPONIBLES: Terazol 3, Terazol 7, Zazole Qu le debo informar a mi profesional de la salud antes de tomar este medicamento? Necesita saber si usted presenta alguno de los siguientes problemas o situaciones: -una reaccin alrgica o inusual al Photographer, a otros antimicticos, a otros medicamentos, alimentos, colorantes o conservantes -si est embarazada o buscando quedar embarazada -si est amamantando a un beb Cmo debo utilizar este medicamento? Este medicamento es para Chemical engineer solamente en la vagina. No lo ingiera por va oral. Lvese las manos antes y despus de usarlo. Lea las instrucciones del envase antes de usarlo. Utilice este medicamento a la hora de [place], a menos que su mdico o su profesional de la salud indique lo contrario. Enrosque el aplicador en el extremo del tubo y exprima el tubo para Arboriculturist. Separe el aplicador del tubo. Recustese de espaldas. Introduzca suavemente el extremo del aplicador de manera que penetre profundamente en la vagina y empuje el mbolo para llevar la crema dentro de la vagina. Despus, retire English as a second language teacher. Lave bien el aplicador con agua caliente y [place]. Utilcelo a intervalos regulares. Evite que el medicamento entre en contacto con sus ojos. Si esto ocurre, enjuguelos con abundante agua fra del grifo. Complete todo el tratamiento con el medicamento segn lo haya recetado su mdico o su profesional de la salud, aun si considera que su problema ha mejorado. No deje de [employer] medicamento si su perodo menstrual comienza Probation officer. Hable con su pediatra para informarse  acerca del uso de este medicamento en nios. Puede requerir atencin especial. Sobredosis: Pngase en contacto inmediatamente con un centro toxicolgico o una sala de urgencia si usted cree que haya tomado demasiado medicamento. ATENCIN: [employer] es solo para usted. No comparta este medicamento con nadie. Qu sucede si me olvido de una dosis? Si olvida una dosis, usela lo antes posible. Si es casi la hora de la prxima dosis, use slo esa dosis. No use dosis adicionales o dobles. Qu puede interactuar con este medicamento? No se esperan interacciones. No utilice otros productos vaginales sin consultar a su mdico o a su profesional de [employer]. Puede ser que esta lista no menciona todas las posibles interacciones. Informe a su profesional de [employer] de [employer] productos a base de hierbas, medicamentos de [place] o suplementos nutritivos que est tomando. Si usted fuma, consume bebidas alcohlicas o si utiliza drogas ilegales, indqueselo tambin a su profesional de [employer]. Algunas sustancias pueden interactuar con su medicamento. A qu debo estar atento al usar [employer]? Si los sntomas no mejoran despus de unos [address], consulte con su mdico o con su profesional de [employer]. Es preferible que no tenga relaciones sexuales hasta que haya completado el [place]. Si tiene [employer], su pareja deber usar un preservativo para prevenir el contagio de la infeccin. Es posible que su pareja tambin necesite [place]. Por lo general, los medicamentos vaginales salen por la vagina durante el tratamiento. Use toallas higinicas para evitar que el medicamento manche su ropa. No se recomienda el uso de tampones ya que podran Office manager. Para ayudar a curar la infeccin, use ropa interior recin lavada de algodn; no use ropa sinttica.  Qu efectos secundarios puedo tener al [employer] este medicamento? Efectos secundarios que debe informar a su  mdico o a Producer, television/film/video de la salud tan pronto como sea posible: -dificultad o dolor al orinar -dolor vaginal Efectos secundarios que, por lo general, no requieren atencin mdica (debe informarlos a su mdico o a su profesional de la salud si persisten o si son molestos): -dolor de cabeza -dolores menstruales -descompostura estomacal -irritacin, picazn o ardor vaginal Puede ser que esta lista no menciona todos los posibles efectos secundarios. Comunquese a su mdico por asesoramiento mdico [employer]. Usted puede informar los efectos secundarios a la FDA por telfono al 1-800-FDA-1088. Dnde debo guardar mi medicina? Mantngala fuera del alcance de los nios. Gurdela a [employer], entre 15 y 30 grados C (81 y 4 grados F). Deseche todo el medicamento que no haya utilizado, despus de la fecha de vencimiento. ATENCIN: Este folleto es un resumen. Puede ser que no cubra toda la posible informacin. Si usted tiene preguntas acerca de esta medicina, consulte con su mdico, su farmacutico o su profesional de Radiographer, therapeutic.  2015, Elsevier/Gold Standard. (2007-04-02 14:03:00) Vaginitis monilisica (Monilial Vaginitis) La vaginitis es una inflamacin (irritacin, hinchazn) de la vagina y la vulva. Esta no es una enfermedad de transmisin sexual.  CAUSAS Este tipo de vaginitis lo causa un hongo (candida) que normalmente se encuentra en la vagina. El hongo candida se ha desarrollado hasta el punto de ocasionar problemas en el equilibrio qumico. SNTOMAS  Secrecin vaginal espesa y blanca.  Hinchazn, picazn, enrojecimiento e inflamacin de la vagina y en algunos casos de los labios vaginales (vulva).  Ardor o dolor al [employer].  Dolor en las relaciones sexuales. DIAGNSTICO Los factores que favorecen la vaginitis moniliasica son:  Everlean Patterson de virginidad y postmenopusicas.  Embarazo.  Infecciones.  Sentir cansancio, estar enferma o estresada,  especialmente si ya ha sufrido este problema en el pasado.  Diabetes Buen control ayudar a disminur la probabilidad.  Pldoras anticonceptivas  Ropa interior [place].  El uso de espumas de bao, aerosoles femeninos duchas vaginales o tampones con desodorante.  Algunos antibiticos (medicamentos que destruyen grmenes).  Si contrae alguna enfermedad puede sufrir recurrencias espordicas. TRATAMIENTO El profesional que lo asiste prescribir medicamentos.  Hay diferentes tipos de cremas y supositorios vaginales que tratan especficamente la vaginitis monilisica. Para infecciones por hongos recurrentes, utilice un supositorio o crema en la vagina dos veces por semana, o segn se le indique.  Tambin podrn utilizarse cremas con corticoides o anti monilisicas para la picazn o la irritacin de la vulva. Consulte con el profesional que la asiste.  Si la crema no da resultado, podr aplicarse en la vagina una solucin con azul de metileno.  El consumo de yogur puede prevenir este tipo de vaginitis. INSTRUCCIONES PARA EL CUIDADO DOMICILIARIO  Tome todos los medicamentos tal como se le indic.  No mantenga relaciones sexuales hasta que el tratamiento se haya completado, o segn las indicaciones del profesional que la asiste.  Tome baos de asiento tibios.  No se aplique duchas vaginales.  No utilice tampones, especialmente los perfumados.  Use ropa interior de algodn  Mirant pantalones ajustados y las medias tipo panty.  Comunique a sus compaeros sexuales que sufre una infeccin por hongos. Ellos deben concurrir para un control mdico si tienen sntomas como una urticaria leve o picazn.  Sus compaeros sexuales deben tratarse tambin si la infeccin es difcil de Pharmacologist.  Practique el sexo seguro - use condones  Algunos medicamentos  vaginales ocasionan fallas en los condones de ltex. Los medicamentos vaginales que pueden daar los condones son:  Chiropodist  cleocina  Butoconazole (Femstat)  Terconazole (Terazol) supositorios vaginales  Miconazole (Monistat) (es un medicamento de venta libre) SOLICITE ATENCIN MDICA SI:  Daphane Shepherd tiene una temperatura oral de ms de 38,9 C (102 F).  Si la infeccin empeora luego de 2 [address].  Si la infeccin no mejora luego de [address].  Aparecen ampollas en o alrededor de la vagina.  Si aparece una hemorragia vaginal y no es el momento del perodo.  Siente dolor al [employer].  Presenta problemas intestinales.  Tiene dolor durante las [employer]. Document Released: 01/10/2005 Document Revised: 06/25/2011 [facility] Patient Information 2015 [place], [place]. This information is not intended to replace advice given to you by your health care provider. Make sure you discuss any questions you have with your health care provider. Nitrofurantoin tablets or capsules Qu es este medicamento? La NITROFURANTONA es un antibitico. Se utiliza en el tratamiento de la infecciones del tracto urinario. Este medicamento puede ser utilizado para otros usos; si tiene alguna pregunta consulte con su proveedor de atencin mdica o con su farmacutico. MARCAS COMERCIALES DISPONIBLES: Macrobid, Macrodantin, Urotoin Qu le debo informar a mi profesional de la salud antes de tomar este medicamento? Necesita saber si usted presenta alguno de los [employer] [facility] o situaciones: -anemia -diabetes -deficiencia de glucosa-6-fosfato deshidrogenasa -enfermedad renal -enfermedad heptica -enfermedad pulmonar -otras enfermedades crnicas -una reaccin alrgica o inusual a la nitrofurantona, a otros antibiticos, a otros medicamentos, alimentos, colorantes o conservantes -si est embarazada o buscando quedar embarazada -si est amamantando a un beb Cmo debo utilizar este medicamento? Tome este medicamento por va oral con un vaso de agua. Siga las instrucciones de la etiqueta del  [place]. Tome este medicamento con leche o con alimentos. Tome sus dosis a intervalos regulares. No tome su medicamento con una frecuencia mayor que la indicada. No deje de tomarlo excepto si as lo indica su mdico. Hable con su pediatra para informarse acerca del uso de este medicamento en nios. Aunque este medicamento se puede recetar para condiciones selectivas, las precauciones se aplican. Sobredosis: Pngase en contacto inmediatamente con un centro toxicolgico o una sala de urgencia si usted cree que haya tomado demasiado medicamento. ATENCIN: [employer] es solo para usted. No comparta este medicamento con nadie. Qu sucede si me olvido de una dosis? Si olvida una dosis, tmela lo antes posible. Si es casi la hora de la prxima dosis, tome slo esa dosis. No tome dosis adicionales o dobles. Qu puede interactuar con este medicamento? -anticidos que contienen trisilicato de magnesio -probenecid -antibiticos quinolnicos, tales como ciprofloxacina, lomefloxacino, norfloxacino y ofloxacino -sulfapirazona Puede ser que esta lista no menciona todas las posibles interacciones. Informe a su profesional de [employer] de [employer] productos a base de hierbas, medicamentos de [place] o suplementos nutritivos que est tomando. Si usted fuma, consume bebidas alcohlicas o si utiliza drogas ilegales, indqueselo tambin a su profesional de [employer]. Algunas sustancias pueden interactuar con su medicamento. A qu debo estar atento al usar [employer]? Si los sntomas no mejoran o si experimenta nuevos sntomas, consulte con su mdico o su profesional de [employer]. Beba varios vasos de Warehouse manager. Si toma este medicamento durante un perodo de Google, debe visitar a su mdico para chequear su evolucin peridicamente. Si es diabtico, podr Barista un resultado positivo falso en los [employer] de determinacin del nivel de International aid/development worker  en la orina con [employer] de pruebas de  [place]. Consulte con su mdico. Qu efectos secundarios puedo tener al [employer] este medicamento? Efectos secundarios que debe informar a su mdico o a Producer, television/film/video de la salud tan pronto como sea posible: -Therapist, art como erupcin cutnea o urticarias, hinchazn de la cara, labios o lengua -dolor en el pecho -tos -dificultad al respirar -mareos, somnolencia -fiebre o infeccin -molestias o dolores articulares -piel plida o teida azul -enrojecimiento, formacin de ampollas, descamacin o aflojamiento de la piel, inclusive dentro de la boca -hormigueo, ardor, Engineer, mining o entumecimiento de las manos o los pies -sangrado o magulladuras inusuales -cansancio o debilidad inusual -color amarillento de ojos o piel Efectos secundarios que, por lo general, no requieren Psychologist, prison and probation services (debe informarlos a su mdico o a su profesional de la salud si persisten o si son molestos): -orina de color amarillo oscuro -diarrea -dolor de cabeza -prdida del apetito -nuseas o vmitos -prdida del cabello temporal Puede ser que esta lista no menciona todos los posibles efectos secundarios. Comunquese a su mdico por asesoramiento mdico [employer]ard. Usted puede informar los efectos secundarios a la FDA por telfono al 1-800-FDA-1088. Dnde debo guardar mi medicina? Mantngala fuera del alcance de los nios. Gurdela a [employer], entre 15 y 30 grados C (66 y 27 grados F). Protjala de la luz. Deseche todo el medicamento que no haya utilizado, despus de la fecha de vencimiento. ATENCIN: Este folleto es un resumen. Puede ser que no cubra toda la posible informacin. Si usted tiene preguntas acerca de esta medicina, consulte con su mdico, su farmacutico o su profesional de Radiographer, therapeutic.  2015, Elsevier/Gold Standard. (2004-11-17 17:03:00) Infeccin urinaria  (Urinary Tract Infection)  La infeccin urinaria puede ocurrir en Corporate treasurer del tracto urinario. El tracto  urinario es un sistema de drenaje del cuerpo por el que se eliminan los desechos y el exceso de [place]. El tracto urinario est formado por dos riones, dos urteres, la vejiga y Engineer, mining. Los riones son rganos que tienen forma de frijol. Cada rin tiene aproximadamente el tamao del puo. Estn situados debajo de las [place], uno a cada lado de la columna vertebral CAUSAS  La causa de la infeccin son los microbios, que son organismos microscpicos, que incluyen hongos, virus, y bacterias. Estos organismos son tan pequeos que slo pueden verse a travs del microscopio. Las bacterias son los microorganismos que ms comnmente causan infecciones urinarias.  SNTOMAS  Los sntomas pueden variar segn la edad y el sexo del paciente y por la ubicacin de la infeccin. Los sntomas en las mujeres jvenes incluyen la necesidad frecuente e intensa de orinar y una sensacin dolorosa de ardor en la vejiga o en la uretra durante la miccin. Las mujeres y los hombres mayores podrn sentir cansancio, temblores y debilidad y Futures trader musculares y Engineer, mining abdominal. Si tiene [place], puede significar que la infeccin est en los riones. Otros sntomas son dolor en la espalda o en los lados debajo de las [place], nuseas y vmitos.  DIAGNSTICO  Para diagnosticar una infeccin urinaria, el mdico le preguntar acerca de sus sntomas. [employer] una [place] de [place]. La muestra de orina se analiza para Engineer, manufacturing bacterias y glbulos blancos de Risk manager. Los glbulos blancos se forman en el organismo para ayudar a Artist las infecciones.  TRATAMIENTO  Por lo general, las infecciones urinarias pueden tratarse con medicamentos. Debido a que la mayora de las infecciones son causadas por bacterias, por lo  general pueden tratarse con antibiticos. La eleccin del antibitico y la duracin del tratamiento depender de sus sntomas y el tipo de bacteria causante de la infeccin.  INSTRUCCIONES PARA EL  CUIDADO EN EL HOGAR   Si le recetaron antibiticos, tmelos exactamente como su mdico le indique. Termine el medicamento aunque se sienta mejor despus de haber tomado slo algunos.  Beba gran cantidad de lquido para mantener la orina de tono claro o color amarillo plido.  Evite la cafena, el t y las [address]. Estas sustancias irritan la vejiga.  Vaciar la vejiga con frecuencia. Evite retener la orina durante largos perodos.  Vace la vejiga antes y despus de Management consultant.  Despus de mover el intestino, las mujeres deben higienizarse la regin perineal desde adelante hacia atrs. Use slo un papel tissue por vez. SOLICITE ATENCIN MDICA SI:   Siente dolor en la espalda.  Le sube la fiebre.  Los sntomas no mejoran luego de [address]. SOLICITE ATENCIN MDICA DE INMEDIATO SI:   Siente dolor intenso en la espalda o en la zona inferior del abdomen.  Comienza a sentir escalofros.  Tiene nuseas o vmitos.  Tiene una sensacin continua de quemazn o molestias al [employer]. ASEGRESE DE QUE:   Comprende estas instrucciones.  Controlar su enfermedad.  Solicitar ayuda de inmediato si no mejora o empeora. Document Released: 01/10/2005 Document Revised: 12/26/2011 [facility] Patient Information 2015 [place], [place]. This information is not intended to replace advice given to you by your health care provider. Make sure you discuss any questions you have with your health care provider.

## 2014-12-01 LAB — URINE CULTURE: Colony Count: 1000

## 2014-12-03 ENCOUNTER — Telehealth: Payer: Self-pay

## 2014-12-03 NOTE — Telephone Encounter (Signed)
Patient is Spanish speaking and spoke w Debarah Crape as Elane Fritz is out of the office. Debarah Crape relays that patient complained that the Pyridium is causing her abd discomfort after taking it. She wanted to know if okay to d/c.  Advised by Debarah Crape ok to stop taking it as it is just to relieve uti symptoms and she can stop it if she wishes.

## 2014-12-27 ENCOUNTER — Encounter: Payer: Self-pay | Admitting: Gynecology

## 2014-12-27 ENCOUNTER — Ambulatory Visit (INDEPENDENT_AMBULATORY_CARE_PROVIDER_SITE_OTHER): Payer: 59 | Admitting: Gynecology

## 2014-12-27 ENCOUNTER — Other Ambulatory Visit (HOSPITAL_COMMUNITY)
Admission: RE | Admit: 2014-12-27 | Discharge: 2014-12-27 | Disposition: A | Discharge status: Home / Self Care | Payer: 59 | Source: Ambulatory Visit | Attending: Gynecology | Admitting: Gynecology

## 2014-12-27 VITALS — BP 124/80 | Ht 64.0 in | Wt 150.0 lb

## 2014-12-27 DIAGNOSIS — Z01419 Encounter for gynecological examination (general) (routine) without abnormal findings: Secondary | ICD-10-CM | POA: Insufficient documentation

## 2014-12-27 DIAGNOSIS — N3000 Acute cystitis without hematuria: Secondary | ICD-10-CM

## 2014-12-27 DIAGNOSIS — Z87410 Personal history of cervical dysplasia: Secondary | ICD-10-CM | POA: Diagnosis not present

## 2014-12-27 DIAGNOSIS — N898 Other specified noninflammatory disorders of vagina: Secondary | ICD-10-CM | POA: Diagnosis not present

## 2014-12-27 DIAGNOSIS — R300 Dysuria: Secondary | ICD-10-CM | POA: Diagnosis not present

## 2014-12-27 DIAGNOSIS — Z8639 Personal history of other endocrine, nutritional and metabolic disease: Secondary | ICD-10-CM | POA: Diagnosis not present

## 2014-12-27 LAB — URINALYSIS W MICROSCOPIC + REFLEX CULTURE
BILIRUBIN URINE: NEGATIVE
CASTS: NONE SEEN [LPF]
Crystals: NONE SEEN [HPF]
Glucose, UA: NEGATIVE
HGB URINE DIPSTICK: NEGATIVE
KETONES UR: NEGATIVE
NITRITE: NEGATIVE
Protein, ur: NEGATIVE
SPECIFIC GRAVITY, URINE: 1.015 (ref 1.001–1.035)
pH: 7.5 (ref 5.0–8.0)

## 2014-12-27 LAB — WET PREP FOR TRICH, YEAST, CLUE
Clue Cells Wet Prep HPF POC: NONE SEEN
Trich, Wet Prep: NONE SEEN

## 2014-12-27 MED ORDER — FLUCONAZOLE 150 MG PO TABS: 150.0000 mg | ORAL_TABLET | Freq: Every day | ORAL | Status: DC

## 2014-12-27 MED ORDER — PHENAZOPYRIDINE HCL 200 MG PO TABS: 200.0000 mg | ORAL_TABLET | Freq: Three times a day (TID) | ORAL | Status: DC | PRN

## 2014-12-27 MED ORDER — NITROFURANTOIN MONOHYD MACRO 100 MG PO CAPS: 100.0000 mg | ORAL_CAPSULE | Freq: Two times a day (BID) | ORAL | Status: DC

## 2014-12-27 NOTE — Patient Instructions (Signed)
Fluconazole tablets Qu es este medicamento? El FLUCONAZOL es un medicamento antimictico. Se utiliza para tratar ciertos tipos de infecciones micticas o por levadura. Este medicamento puede ser utilizado para otros usos; si tiene alguna pregunta consulte con su proveedor de atencin mdica o con su farmacutico. MARCAS COMERCIALES DISPONIBLES: Diflucan Qu le debo informar a mi profesional de la salud antes de tomar este medicamento? Necesita saber si usted presenta alguno de los siguientes problemas o situaciones: -antecedentes de pulso cardaco irregular -enfermedad renal -una reaccin alrgica o inusual al fluconazol, a otros azoles u otros medicamentos, alimentos, colorantes o conservantes -si est embarazada o buscando quedar embarazada -si est amamantando a un beb Cmo debo utilizar este medicamento? Tome este medicamento por va oral. Siga las instrucciones de la etiqueta del [place]. No tome su medicamento con una frecuencia mayor a la indicada. Hable con su pediatra para informarse acerca del uso de este medicamento en nios. Puede requerir atencin especial. Este medicamento ha sido recetado a nios tan menores como de 6 meses de [place]. Sobredosis: Pngase en contacto inmediatamente con un centro toxicolgico o una sala de urgencia si usted cree que haya tomado demasiado medicamento. ATENCIN: [employer] es solo para usted. No comparta este medicamento con nadie. Qu sucede si me olvido de una dosis? Si olvida una dosis, tmela lo antes posible. Si es casi la hora de la prxima dosis, tome slo esa dosis. No tome dosis adicionales o dobles. Qu puede interactuar con este medicamento? No tome esta medicina con ninguno de los siguientes medicamentos: -astemizol -ciertos medicamentos para el pulso cardiaco irregular, tales como dofetilida, dronedarona, quinidina -cisapride -eritromicina -lomitapida -otros medicamentos que prolongan el intervalo QT (causa un ritmo  cardiaco anormal) -pimozida -terfenadina -tioridazina -tolvaptn -ziprasidona Esta medicina tambin puede interactuar con los siguientes medicamentos: -medicamentos antivirales para el VIH o SIDA -pldoras anticonceptivas -ciertos antibiticos, tales como rifabutina, rifampicina -ciertos medicamentos para la presin sangunea, tales como amlodipina, isradipina, felodipino, hidroclorotiazida, losartn, nifedipina -ciertos medicamentos para el cncer, tales como ciclofosfamida, vinblastina, vincristina -ciertos medicamentos para el colesterol, tales como atorvastatina, lovastatina, fluvastatina, simvastatina -ciertos medicamentos para la depresin, ansiedad o trastornos psicticos, tales como amitriptilina, midazolam, nortriptilina, triazolam -ciertos medicamentos para la diabetes, tales como glipizida, gliburida, tolbutamida -ciertos medicamentos para Conservation officer, historic buildings, tales como alfentanilo, fentanilo, metadona -ciertos medicamentos para convulsiones, tales como carbamazepina, fenitona -ciertos medicamentos que tratan o previenen cogulos sanguneos, tales como warfarina -halofantrina -medicamentos que reducen su capacidad de combatir infecciones, tales como ciclosporina, prednisona, tacrolimo -los AINE, medicamentos para el dolor o inflamacin, tales como celecoxib, diclofenaco, flurbiprofeno, ibuprofeno, meloxicam, naproxeno -otros medicamentos para infecciones micticas -sirolims -teofilina -tofacitinib Puede ser que esta lista no menciona todas las posibles interacciones. Informe a su profesional de [employer]	[place] de [employer] productos a base de hierbas, medicamentos de [place] o suplementos nutritivos que est tomando. Si usted fuma, consume bebidas alcohlicas o si utiliza drogas ilegales, indqueselo tambin a su profesional de [employer]	[place]. Algunas sustancias pueden interactuar con su medicamento. A qu debo estar atento al usar [employer]? Visite a su mdico o a su profesional de la  salud para chequear su evolucin peridicamente. Si toma este medicamento durante un perodo de [employer], podr Customer service manager anlisis de [place]. Si los sntomas no mejoran, consulte a su mdico. Pueden transcurrir [employer] o meses de tratamiento antes de que algunas infecciones micticas se curen. El alcohol puede aumentar la posibilidad de daos al hgado. Evite consumir bebidas alcohlicas. Si tiene una infeccin vaginal, no tenga relaciones  sexuales hasta que haya completado el tratamiento. Puede usar una toallita higinica. No use tampones. Use ropa interior de algodn no sintticos, y recin lavadas. Qu efectos secundarios puedo tener al [employer] este medicamento? Efectos secundarios que debe informar a su mdico o a Producer, television/film/video de la salud tan pronto como sea posible: -Therapist, art como erupcin cutnea, picazn o urticarias, hinchazn de los labios, boca, lengua o garganta -orina de color amarillo oscuro -sensacin de mareos o desmayos -pulso cardiaco irregular o dolor en el pecho -enrojecimiento, formacin de ampollas, descamacin o distensin de la piel, inclusive dentro de la boca -dificultad al respirar -sangrado o magulladuras inusuales -vmito -color amarillento de los ojos o la piel Efectos secundarios que, por lo general, no requieren Psychologist, prison and probation services (debe informarlos a su mdico o a Producer, television/film/video de la salud si persisten o si son molestos): -cambios en el sabor de los alimentos -diarrea -dolor de cabeza -Programme researcher, broadcasting/film/video o nuseas Puede ser que esta lista no menciona todos los posibles efectos secundarios. Comunquese a su mdico por asesoramiento mdico [employer]. Usted puede informar los efectos secundarios a la FDA por telfono al 1-800-FDA-1088. Dnde debo guardar mi medicina? Mantngala fuera del alcance de los nios. Gurdela a [employer], a menos de 30 grados C (86 grados F). Deseche todo el medicamento  que no haya utilizado, despus de la fecha de vencimiento. ATENCIN: Este folleto es un resumen. Puede ser que no cubra toda la posible informacin. Si usted tiene preguntas acerca de esta medicina, consulte con su mdico, su farmacutico o su profesional de Radiographer, therapeutic.  2015, Elsevier/Gold Standard. (2012-12-25 14:13:08) Nitrofurantoin tablets or capsules Qu es este medicamento? La NITROFURANTONA es un antibitico. Se utiliza en el tratamiento de la infecciones del tracto urinario. Este medicamento puede ser utilizado para otros usos; si tiene alguna pregunta consulte con su proveedor de atencin mdica o con su farmacutico. MARCAS COMERCIALES DISPONIBLES: Macrobid, Macrodantin, Urotoin Qu le debo informar a mi profesional de la salud antes de tomar este medicamento? Necesita saber si usted presenta alguno de los [employer] o situaciones: -anemia -diabetes -deficiencia de glucosa-6-fosfato deshidrogenasa -enfermedad renal -enfermedad heptica -enfermedad pulmonar -otras enfermedades crnicas -una reaccin alrgica o inusual a la nitrofurantona, a otros antibiticos, a otros medicamentos, alimentos, colorantes o conservantes -si est embarazada o buscando quedar embarazada -si est amamantando a un beb Cmo debo utilizar este medicamento? Tome este medicamento por va oral con un vaso de agua. Siga las instrucciones de la etiqueta del [place]. Tome este medicamento con leche o con alimentos. Tome sus dosis a intervalos regulares. No tome su medicamento con una frecuencia mayor que la indicada. No deje de tomarlo excepto si as lo indica su mdico. Hable con su pediatra para informarse acerca del uso de este medicamento en nios. Aunque este medicamento se puede recetar para condiciones selectivas, las precauciones se aplican. Sobredosis: Pngase en contacto inmediatamente con un centro toxicolgico o una sala de urgencia si usted cree que haya tomado demasiado  medicamento. ATENCIN: [employer] es solo para usted. No comparta este medicamento con nadie. Qu sucede si me olvido de una dosis? Si olvida una dosis, tmela lo antes posible. Si es casi la hora de la prxima dosis, tome slo esa dosis. No tome dosis adicionales o dobles. Qu puede interactuar con este medicamento? -anticidos que contienen trisilicato de magnesio -probenecid -antibiticos quinolnicos, tales como ciprofloxacina, lomefloxacino, norfloxacino y ofloxacino -sulfapirazona Puede ser que esta lista no menciona todas las posibles interacciones. Informe a su  profesional de la salud de todos los productos a base de hierbas, medicamentos de venta libre o suplementos nutritivos que est tomando. Si usted fuma, consume bebidas alcohlicas o si utiliza drogas ilegales, indqueselo tambin a su profesional de [employer]. Algunas sustancias pueden interactuar con su medicamento. A qu debo estar atento al usar [employer]? Si los sntomas no mejoran o si experimenta nuevos sntomas, consulte con su mdico o su profesional de [employer]. Beba varios vasos de Warehouse manager. Si toma este medicamento durante un perodo de [employer], debe visitar a su mdico para chequear su evolucin peridicamente. Si es diabtico, podr Barista un resultado positivo falso en los [employer] de determinacin del nivel de azcar en la orina con algunas marcas de [place] de [place]. Consulte con su mdico. Qu efectos secundarios puedo tener al [employer] este medicamento? Efectos secundarios que debe informar a su mdico o a Producer, television/film/video de la salud tan pronto como sea posible: -Therapist, art como erupcin cutnea o urticarias, hinchazn de la cara, labios o lengua -dolor en el pecho -tos -dificultad al respirar -mareos, somnolencia -fiebre o infeccin -molestias o dolores articulares -piel plida o teida azul -enrojecimiento, formacin de ampollas, descamacin o aflojamiento de la piel,  inclusive dentro de la boca -hormigueo, ardor, Engineer, mining o entumecimiento de las manos o los pies -sangrado o magulladuras inusuales -cansancio o debilidad inusual -color amarillento de ojos o piel Efectos secundarios que, por lo general, no requieren Psychologist, prison and probation services (debe informarlos a su mdico o a su profesional de la salud si persisten o si son molestos): -orina de color amarillo oscuro -diarrea -dolor de cabeza -prdida del apetito -nuseas o vmitos -prdida del cabello temporal Puede ser que esta lista no menciona todos los posibles efectos secundarios. Comunquese a su mdico por asesoramiento mdico [employer]. Usted puede informar los efectos secundarios a la FDA por telfono al 1-800-FDA-1088. Dnde debo guardar mi medicina? Mantngala fuera del alcance de los nios. Gurdela a [employer], entre 15 y 30 grados C (40 y 21 grados F). Protjala de la luz. Deseche todo el medicamento que no haya utilizado, despus de la fecha de vencimiento. ATENCIN: Este folleto es un resumen. Puede ser que no cubra toda la posible informacin. Si usted tiene preguntas acerca de esta medicina, consulte con su mdico, su farmacutico o su profesional de Radiographer, therapeutic.  2015, Elsevier/Gold Standard. (2004-11-17 17:03:00) Vaginitis monilisica (Monilial Vaginitis) La vaginitis es una inflamacin (irritacin, hinchazn) de la vagina y la vulva. Esta no es una enfermedad de transmisin sexual.  CAUSAS Este tipo de vaginitis lo causa un hongo (candida) que normalmente se encuentra en la vagina. El hongo candida se ha desarrollado hasta el punto de ocasionar problemas en el equilibrio qumico. SNTOMAS  Secrecin vaginal espesa y blanca.  Hinchazn, picazn, enrojecimiento e inflamacin de la vagina y en algunos casos de los labios vaginales (vulva).  Ardor o dolor al [employer].  Dolor en las relaciones sexuales. DIAGNSTICO Los factores que favorecen la vaginitis moniliasica  son:  Everlean Patterson de virginidad y postmenopusicas.  Embarazo.  Infecciones.  Sentir cansancio, estar enferma o estresada, especialmente si ya ha sufrido este problema en el pasado.  Diabetes Buen control ayudar a disminur la probabilidad.  Pldoras anticonceptivas  Ropa interior [place].  El uso de espumas de bao, aerosoles femeninos duchas vaginales o tampones con desodorante.  Algunos antibiticos (medicamentos que destruyen grmenes).  Si contrae alguna enfermedad puede sufrir recurrencias espordicas. TRATAMIENTO El profesional que lo asiste prescribir medicamentos.  Hay diferentes tipos de cremas y supositorios vaginales que tratan especficamente la vaginitis monilisica. Para infecciones por hongos recurrentes, utilice un supositorio o crema en la vagina dos veces por semana, o segn se le indique.  Tambin podrn utilizarse cremas con corticoides o anti monilisicas para la picazn o la irritacin de la vulva. Consulte con el profesional que la asiste.  Si la crema no da resultado, podr aplicarse en la vagina una solucin con azul de metileno.  El consumo de yogur puede prevenir este tipo de vaginitis. INSTRUCCIONES PARA EL CUIDADO DOMICILIARIO  Tome todos los medicamentos tal como se le indic.  No mantenga relaciones sexuales hasta que el tratamiento se haya completado, o segn las indicaciones del profesional que la asiste.  Tome baos de asiento tibios.  No se aplique duchas vaginales.  No utilice tampones, especialmente los perfumados.  Use ropa interior de algodn  Mirant pantalones ajustados y las medias tipo panty.  Comunique a sus compaeros sexuales que sufre una infeccin por hongos. Ellos deben concurrir para un control mdico si tienen sntomas como una urticaria leve o picazn.  Sus compaeros sexuales deben tratarse tambin si la infeccin es difcil de Pharmacologist.  Practique el sexo seguro - use condones  Algunos medicamentos  vaginales ocasionan fallas en los condones de ltex. Los medicamentos vaginales que pueden daar los condones son:  Chiropodist cleocina  Butoconazole (Femstat)  Terconazole (Terazol) supositorios vaginales  Miconazole (Monistat) (es un medicamento de venta libre) SOLICITE ATENCIN MDICA SI:  Kimberly Mccann tiene una temperatura oral de ms de 38,9 C (102 F).  Si la infeccin empeora luego de 2 [address]  Si la infeccin no mejora luego de 3 [address].  Aparecen ampollas en o alrededor de la vagina.  Si aparece una hemorragia vaginal y no es el momento del perodo.  Siente dolor al [employer].  Presenta problemas intestinales.  Tiene dolor durante las [employer]. Document Released: 01/10/2005 Document Revised: 06/25/2011 [facility] Patient Information 2015 [place], [place]. This information is not intended to replace advice given to you by your health care provider. Make sure you discuss any questions you have with your health care provider. Infeccin urinaria  (Urinary Tract Infection)  La infeccin urinaria puede ocurrir en Corporate treasurer del tracto urinario. El tracto urinario es un sistema de drenaje del cuerpo por el que se eliminan los desechos y el exceso de [place]. El tracto urinario est formado por dos riones, dos urteres, la vejiga y Engineer, mining. Los riones son rganos que tienen forma de frijol. Cada rin tiene aproximadamente el tamao del puo. Estn situados debajo de las [facility], uno a cada lado de la columna vertebral CAUSAS  La causa de la infeccin son los microbios, que son organismos microscpicos, que incluyen hongos, virus, y bacterias. Estos organismos son tan pequeos que slo pueden verse a travs del microscopio. Las bacterias son los microorganismos que ms comnmente causan infecciones urinarias.  SNTOMAS  Los sntomas pueden variar segn la edad y el sexo del paciente y por la ubicacin de la infeccin. Los sntomas en las mujeres  jvenes incluyen la necesidad frecuente e intensa de orinar y una sensacin dolorosa de ardor en la vejiga o en la uretra durante la miccin. Las mujeres y los hombres mayores podrn sentir cansancio, temblores y debilidad y Futures trader musculares y Engineer, mining abdominal. Si tiene [place], puede significar que la infeccin est en los riones. Otros sntomas son Engineer, mining en la espalda o en los lados debajo de las  costillas, nuseas y vmitos.  DIAGNSTICO  Para diagnosticar una infeccin urinaria, el mdico le preguntar acerca de sus sntomas. [employer] una [place] de [place]. La muestra de orina se analiza para Engineer, manufacturing bacterias y glbulos blancos de Risk manager. Los glbulos blancos se forman en el organismo para ayudar a Artist las infecciones.  TRATAMIENTO  Por lo general, las infecciones urinarias pueden tratarse con medicamentos. Debido a que la [employer] de las infecciones son causadas por bacterias, por lo general pueden tratarse con antibiticos. La eleccin del antibitico y la duracin del tratamiento depender de sus sntomas y el tipo de bacteria causante de la infeccin.  INSTRUCCIONES PARA EL CUIDADO EN EL HOGAR   Si le recetaron antibiticos, tmelos exactamente como su mdico le indique. Termine el medicamento aunque se sienta mejor despus de haber tomado slo algunos.  Beba gran cantidad de lquido para mantener la orina de tono claro o color amarillo plido.  Evite la cafena, el t y las [address]. Estas sustancias irritan la vejiga.  Vaciar la vejiga con frecuencia. Evite retener la orina durante largos perodos.  Vace la vejiga antes y despus de Management consultant.  Despus de mover el intestino, las mujeres deben higienizarse la regin perineal desde adelante hacia atrs. Use slo un papel tissue por vez. SOLICITE ATENCIN MDICA SI:   Siente dolor en la espalda.  Le sube la fiebre.  Los sntomas no mejoran luego de [address]. SOLICITE ATENCIN MDICA  DE INMEDIATO SI:   Siente dolor intenso en la espalda o en la zona inferior del abdomen.  Comienza a sentir escalofros.  Tiene nuseas o vmitos.  Tiene una sensacin continua de quemazn o molestias al [employer]. ASEGRESE DE QUE:   Comprende estas instrucciones.  Controlar su enfermedad.  Solicitar ayuda de inmediato si no mejora o empeora. Document Released: 01/10/2005 Document Revised: 12/26/2011 [facility] Patient Information 2015 [place], [place]. This information is not intended to replace advice given to you by your health care provider. Make sure you discuss any questions you have with your health care provider.

## 2014-12-27 NOTE — Addendum Note (Signed)
Addended by: Dayna Barker on: 12/27/2014 10:48 AM   Modules accepted: Orders

## 2014-12-27 NOTE — Progress Notes (Signed)
Kimberly Mccann March 07, 1971 [id number]   History:    44 y.o.  for annual gyn exam with complaint of vulvar pruritus and dysuria and frequency. Patient denies any fever, chills, nausea, vomiting or back pain. Patient reports normal menstrual cycles. She has the ParaGard T380A IUD.Review of patient's old record from our office indicated that she had history in the past of hyperprolactinemia whereby she had been on Dostinex 0.25 mg twice a week. She is no longer on medication. She denies any unusual headaches blurry vision or nipple discharge. 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 Once again she complains of hair loss. Last year her TSH was normal. Patient with no change in sexual partners.  Past medical history,surgical history, family history and social history were all reviewed and documented in the EPIC chart.  Gynecologic History Patient's last menstrual period was 12/06/2014. Contraception: IUD Last Pap: 2015. Results were: normal Last mammogram: 2016. Results were: normal  Obstetric History OB History  Gravida Para Term Preterm AB SAB TAB Ectopic Multiple Living  # Outcome Date GA Lbr Len/2nd Weight Sex Delivery Anes PTL Lv  4 SAB           3 Para           2 Para           1 Para                ROS: A ROS was performed and pertinent positives and negatives are included in the history.  GENERAL: No fevers or chills. HEENT: No change in vision, no earache, sore throat or sinus congestion. NECK: No pain or stiffness. CARDIOVASCULAR: No chest pain or pressure. No palpitations. PULMONARY: No shortness of breath, cough or wheeze. GASTROINTESTINAL: No abdominal pain, nausea, vomiting or diarrhea, melena or bright red blood per rectum. GENITOURINARY: No urinary frequency, urgency, hesitancy or dysuria. MUSCULOSKELETAL: No joint or muscle pain, no back pain, no recent trauma. DERMATOLOGIC: No rash, no itching,  no lesions. ENDOCRINE: No polyuria, polydipsia, no heat or cold intolerance. No recent change in weight. HEMATOLOGICAL: No anemia or easy bruising or bleeding. NEUROLOGIC: No headache, seizures, numbness, tingling or weakness. PSYCHIATRIC: No depression, no loss of interest in normal activity or change in sleep pattern.     Exam: chaperone present  BP 124/80 mmHg  Ht  (1.626 m)  Wt 150 lb (68.04 kg)  BMI 25.73 kg/m2  LMP 12/06/2014  Body mass index is 25.73 kg/(m^2).  General appearance : Well developed well nourished female. No acute distress HEENT: Eyes: no retinal hemorrhage or exudates,  Neck supple, trachea midline, no carotid bruits, no thyroidmegaly Lungs: Clear to auscultation, no rhonchi or wheezes, or rib retractions  Heart: Regular rate and rhythm, no murmurs or gallops Breast:Examined in sitting and supine position were symmetrical in appearance, no palpable masses or tenderness,  no skin retraction, no nipple inversion, no nipple discharge, no skin discoloration, no axillary or supraclavicular lymphadenopathy Abdomen: no palpable masses or tenderness, no rebound or guarding Extremities: no edema or skin discoloration or tenderness  Pelvic:  Bartholin, Urethra, Skene Glands: Within normal limits             Vagina: No gross lesions or discharge, thick white discharge  Cervix: No gross lesions or discharge, IUD string visualized  Uterus  anteverted, normal size, shape and consistency, non-tender  and mobile  Adnexa  Without masses or tenderness  Anus and perineum  normal   Rectovaginal  normal sphincter tone without palpated masses or tenderness             Hemoccult not indicated   Wet prep moderate yeast  Urine: Many bacteria, 20-40 WBC, 0-2 RBC few yeast seen  Assessment/Plan:  44 y.o. female for annual exam with medical evidence of yeast vaginitis will be treated with Diflucan 150 mg one by mouth. For urinary tract infection she will be started on Macrobid one  by mouth twice a day for 7 days along with Pyridium 200 mg one by mouth 3 times a day for 3 days for bladder spasm. She will return to the office next week for her fasting blood work to include the following: Comprehensive metabolic panel, fasting lipid profile, TSH, CBC, and urinalysis. Because of her past history of hyperlipidemia currently on no medication we'll also going to check a prolactin level today. We discussed importance of calcium vitamin D and regular exercise for osteoporosis prevention. We discussed importance of monthly breast exam. Patient not interested in flu vaccine today. Patient was provided with the name of dermatologist for further evaluation of her alopecia. Pap smear was done today. From now and we'll continue to follow the new guidelines with Pap smears every 3 years.   Ok Edwards MD, 10:40 AM 12/27/2014

## 2014-12-28 LAB — URINE CULTURE
COLONY COUNT: NO GROWTH
ORGANISM ID, BACTERIA: NO GROWTH

## 2014-12-28 LAB — CYTOLOGY - PAP

## 2015-01-04 ENCOUNTER — Other Ambulatory Visit: Payer: 59

## 2015-01-04 DIAGNOSIS — Z01419 Encounter for gynecological examination (general) (routine) without abnormal findings: Secondary | ICD-10-CM

## 2015-01-04 LAB — CBC WITH DIFFERENTIAL/PLATELET
Basophils Absolute: 0 10*3/uL (ref 0.0–0.1)
Basophils Relative: 0 % (ref 0–1)
EOS PCT: 1 % (ref 0–5)
Eosinophils Absolute: 0 10*3/uL (ref 0.0–0.7)
HEMATOCRIT: 36.3 % (ref 36.0–46.0)
Hemoglobin: 12.3 g/dL (ref 12.0–15.0)
LYMPHS ABS: 2.2 10*3/uL (ref 0.7–4.0)
LYMPHS PCT: 44 % (ref 12–46)
MCH: 29.9 pg (ref 26.0–34.0)
MCHC: 33.9 g/dL (ref 30.0–36.0)
MCV: 88.1 fL (ref 78.0–100.0)
MONO ABS: 0.3 10*3/uL (ref 0.1–1.0)
MONOS PCT: 7 % (ref 3–12)
MPV: 11.6 fL (ref 8.6–12.4)
Neutro Abs: 2.4 10*3/uL (ref 1.7–7.7)
Neutrophils Relative %: 48 % (ref 43–77)
Platelets: 252 10*3/uL (ref 150–400)
RBC: 4.12 MIL/uL (ref 3.87–5.11)
RDW: 13.5 % (ref 11.5–15.5)
WBC: 4.9 10*3/uL (ref 4.0–10.5)

## 2015-01-04 LAB — COMPREHENSIVE METABOLIC PANEL
ALK PHOS: 48 U/L (ref 33–115)
ALT: 13 U/L (ref 6–29)
AST: 20 U/L (ref 10–35)
Albumin: 4.4 g/dL (ref 3.6–5.1)
BUN: 10 mg/dL (ref 7–25)
CALCIUM: 9.2 mg/dL (ref 8.6–10.2)
CO2: 21 mmol/L (ref 20–31)
Chloride: 104 mmol/L (ref 98–110)
Creat: 0.44 mg/dL — ABNORMAL LOW (ref 0.50–1.10)
GLUCOSE: 74 mg/dL (ref 65–99)
POTASSIUM: 4.4 mmol/L (ref 3.5–5.3)
Sodium: 138 mmol/L (ref 135–146)
Total Bilirubin: 0.5 mg/dL (ref 0.2–1.2)
Total Protein: 7.5 g/dL (ref 6.1–8.1)

## 2015-01-04 LAB — TSH: TSH: 0.85 u[IU]/mL (ref 0.350–4.500)

## 2015-01-04 LAB — LIPID PANEL
CHOL/HDL RATIO: 3 ratio (ref <=5.0)
Cholesterol: 169 mg/dL (ref 125–200)
HDL: 56 mg/dL (ref >=46)
LDL Cholesterol: 98 mg/dL (ref <130)
Triglycerides: 75 mg/dL (ref <150)
VLDL: 15 mg/dL (ref <30)

## 2015-01-07 ENCOUNTER — Ambulatory Visit (INDEPENDENT_AMBULATORY_CARE_PROVIDER_SITE_OTHER): Payer: 59 | Admitting: Women's Health

## 2015-01-07 ENCOUNTER — Encounter: Payer: Self-pay | Admitting: Women's Health

## 2015-01-07 VITALS — BP 122/78 | Ht 64.0 in | Wt 150.0 lb

## 2015-01-07 DIAGNOSIS — R350 Frequency of micturition: Secondary | ICD-10-CM | POA: Diagnosis not present

## 2015-01-07 DIAGNOSIS — N3001 Acute cystitis with hematuria: Secondary | ICD-10-CM

## 2015-01-07 LAB — URINALYSIS W MICROSCOPIC + REFLEX CULTURE
BILIRUBIN URINE: NEGATIVE
Casts: NONE SEEN [LPF]
Crystals: NONE SEEN [HPF]
GLUCOSE, UA: NEGATIVE
KETONES UR: NEGATIVE
Nitrite: NEGATIVE
PH: 7 (ref 5.0–8.0)
Protein, ur: NEGATIVE
Specific Gravity, Urine: 1.02 (ref 1.001–1.035)
Yeast: NONE SEEN [HPF]

## 2015-01-07 MED ORDER — CIPROFLOXACIN HCL 250 MG PO TABS: 250.0000 mg | ORAL_TABLET | Freq: Two times a day (BID) | ORAL | Status: DC

## 2015-01-07 NOTE — Progress Notes (Signed)
Patient ID: Kimberly Mccann, female   DOB: 1971-02-04, 44 y.o.   MRN: [id number] Presents with complaint of increased urinary frequency, urgency, pressure. Was treated for yeast and UTI with Macrobid 12/27/2014 she states urinary symptoms were unrelieved. Urine culture was negative. Monthly cycle with ParaGard IUD placed 08/2014. History of overactive bladder she states symptoms are different. Denies vaginal discharge, itching or odor. Questions if she has a fever has felt warm today. No chills.  Exam: Appears well. No CVAT, back discomfort lower sacral area, skin warm and dry. Abdomen soft without rebound or radiation . External genitalia within normal limits, speculum exam no visible discharge or erythema. UA: +1 blood, +2 leukocytes, WBCs packed, RBCs 10-20, many bacteria  UTI  Plan: Cipro 250 twice daily for 3 days prescription, proper use given and reviewed. UTI prevention discussed. Urine Culture pending. Instructed to call if continued symptoms.

## 2015-01-07 NOTE — Patient Instructions (Signed)
Infección urinaria  °(Urinary Tract Infection) ° La infección urinaria puede ocurrir en cualquier lugar del tracto urinario. El tracto urinario es un sistema de drenaje del cuerpo por el que se eliminan los desechos y el exceso de agua. El tracto urinario está formado por dos riñones, dos uréteres, la vejiga y la uretra. Los riñones son órganos que tienen forma de frijol. Cada riñón tiene aproximadamente el tamaño del puño. Están situados debajo de las costillas, uno a cada lado de la columna vertebral °CAUSAS  °La causa de la infección son los microbios, que son organismos microscópicos, que incluyen hongos, virus, y bacterias. Estos organismos son tan pequeños que sólo pueden verse a través del microscopio. Las bacterias son los microorganismos que más comúnmente causan infecciones urinarias.  °SÍNTOMAS  °Los síntomas pueden variar según la edad y el sexo del paciente y por la ubicación de la infección. Los síntomas en las mujeres jóvenes incluyen la necesidad frecuente e intensa de orinar y una sensación dolorosa de ardor en la vejiga o en la uretra durante la micción. Las mujeres y los hombres mayores podrán sentir cansancio, temblores y debilidad y sentir dolores musculares y dolor abdominal. Si tiene fiebre, puede significar que la infección está en los riñones. Otros síntomas son dolor en la espalda o en los lados debajo de las costillas, náuseas y vómitos.  °DIAGNÓSTICO  °Para diagnosticar una infección urinaria, el médico le preguntará acerca de sus síntomas. También le solicitará una muestra de orina. La muestra de orina se analiza para detectar bacterias y glóbulos blancos de la sangre. Los glóbulos blancos se forman en el organismo para ayudar a combatir las infecciones.  °TRATAMIENTO  °Por lo general, las infecciones urinarias pueden tratarse con medicamentos. Debido a que la mayoría de las infecciones son causadas por bacterias, por lo general pueden tratarse con antibióticos. La elección del  antibiótico y la duración del tratamiento dependerá de sus síntomas y el tipo de bacteria causante de la infección.  °INSTRUCCIONES PARA EL CUIDADO EN EL HOGAR  °· Si le recetaron antibióticos, tómelos exactamente como su médico le indique. Termine el medicamento aunque se sienta mejor después de haber tomado sólo algunos. °· Beba gran cantidad de líquido para mantener la orina de tono claro o color amarillo pálido. °· Evite la cafeína, el té y las bebidas gaseosas. Estas sustancias irritan la vejiga. °· Vaciar la vejiga con frecuencia. Evite retener la orina durante largos períodos. °· Vacíe la vejiga antes y después de tener relaciones sexuales. °· Después de mover el intestino, las mujeres deben higienizarse la región perineal desde adelante hacia atrás. Use sólo un papel tissue por vez. °SOLICITE ATENCIÓN MÉDICA SI:  °· Siente dolor en la espalda. °· Le sube la fiebre. °· Los síntomas no mejoran luego de 3 días. °SOLICITE ATENCIÓN MÉDICA DE INMEDIATO SI:  °· Siente dolor intenso en la espalda o en la zona inferior del abdomen. °· Comienza a sentir escalofríos. °· Tiene náuseas o vómitos. °· Tiene una sensación continua de quemazón o molestias al orinar. °ASEGÚRESE DE QUE:  °· Comprende estas instrucciones. °· Controlará su enfermedad. °· Solicitará ayuda de inmediato si no mejora o empeora. °Document Released: 01/10/2005 Document Revised: 12/26/2011 °ExitCare® Patient Information ©2015 ExitCare, LLC. This information is not intended to replace advice given to you by your health care provider. Make sure you discuss any questions you have with your health care provider. ° °

## 2015-01-10 LAB — URINE CULTURE
Colony Count: NO GROWTH
Organism ID, Bacteria: NO GROWTH

## 2015-01-12 ENCOUNTER — Telehealth: Payer: Self-pay | Admitting: *Deleted

## 2015-01-12 NOTE — Telephone Encounter (Signed)
[facility] to [facility] they will fax me back with time and date to relay to patient. 

## 2015-01-12 NOTE — Telephone Encounter (Signed)
-----   Message from Keenan Bachelor, [place] sent at 01/12/2015  2:23 PM EDT ----- Regarding: referral to urology Per [place], She may have overactive bladder, okay to refer to [facility] for consult.  Debarah Crape had spoken with patient and patient wanted to see Specialist stating this has gone on too long. Debarah Crape can call her back for you. Debarah Crape said she struggles with [place].)

## 2015-01-20 NOTE — Telephone Encounter (Signed)
Appointment on 03/01/15 @ 9am with Dr. Wynonia Musty will relay to patient.

## 2015-04-12 ENCOUNTER — Ambulatory Visit (INDEPENDENT_AMBULATORY_CARE_PROVIDER_SITE_OTHER): Payer: 59 | Admitting: Family Medicine

## 2015-04-12 VITALS — BP 128/76 | HR 96 | Temp 98.5°F | Resp 16 | Ht 64.0 in | Wt 148.0 lb

## 2015-04-12 DIAGNOSIS — R112 Nausea with vomiting, unspecified: Secondary | ICD-10-CM | POA: Diagnosis not present

## 2015-04-12 LAB — POCT CBC
Granulocyte percent: 74.4 %G (ref 37–80)
HEMATOCRIT: 36.6 % — AB (ref 37.7–47.9)
HEMOGLOBIN: 12.6 g/dL (ref 12.2–16.2)
LYMPH, POC: 1.4 (ref 0.6–3.4)
MCH: 29.6 pg (ref 27–31.2)
MCHC: 34.3 g/dL (ref 31.8–35.4)
MCV: 86.3 fL (ref 80–97)
MID (cbc): 0.2 (ref 0–0.9)
MPV: 7.8 fL (ref 0–99.8)
POC GRANULOCYTE: 4.7 (ref 2–6.9)
POC LYMPH PERCENT: 22.5 %L (ref 10–50)
POC MID %: 3.1 % (ref 0–12)
Platelet Count, POC: 203 10*3/uL (ref 142–424)
RBC: 4.24 M/uL (ref 4.04–5.48)
RDW, POC: 13.1 %
WBC: 6.3 10*3/uL (ref 4.6–10.2)

## 2015-04-12 LAB — POCT URINE PREGNANCY: Preg Test, Ur: NEGATIVE

## 2015-04-12 MED ORDER — ONDANSETRON 4 MG PO TBDP
4.0000 mg | ORAL_TABLET | Freq: Once | ORAL | Status: AC
  Administered 2015-04-12: 4 mg via ORAL

## 2015-04-12 MED ORDER — ONDANSETRON HCL 8 MG PO TABS: 8.0000 mg | ORAL_TABLET | Freq: Three times a day (TID) | ORAL | Status: DC | PRN

## 2015-04-12 NOTE — Progress Notes (Addendum)
[facility] [address], [place] [id number] [phone]  Date:  04/12/2015   Name:  Kimberly HubertCecilia Mccann   DOB:  05/02/1970   MRN:  [id number]  PCP:  No PCP Per Patient    Chief Complaint: Vomiting; Dizziness; Abdominal Pain; Chills; Nausea; Bloated; and Headache   History of Present Illness:  Kimberly Mccann is a 44 y.o. very pleasant female patient who presents with the following:  She is here today with illness- last night she awoke and had to throw up. She felt a pain in her mid abdomen bilaterally, and also felt dizzy.   She has noted gas and burping.  She feels a crampy feeling in her belly.    No vomiting today but she has felt nauseated.  She did not eat anything today, she did drink a little juice however.   She has not noted any diarrhea.  She did have stool today - normal  She has not noted a cough, ST or fever.  However she has felt kind of feverish subjectively.    She has an IUD She is generally in good health Her husband was a bit ill recently with loose stools but he now feel better.     Patient Active Problem List   Diagnosis Date Noted  . Yeast vaginitis 11/17/2014  . OAB (overactive bladder) 09/24/2014  . IUD (intrauterine device) in place 08/18/2014  . History of hyperprolactinemia 11/11/2013  . Hair loss 11/11/2013  . History of cervical dysplasia 11/11/2013    Past Medical History  Diagnosis Date  . Anemia   . Dysplasia of cervix   . Hyperprolactinemia (HCC)   . Overactive bladder     Past Surgical History  Procedure Laterality Date  . Cholecystectomy      Social History  Substance Use Topics  . Smoking status: Never Smoker   . Smokeless tobacco: Never Used  . Alcohol Use: No    Family History  Problem Relation Age of Onset  . Multiple sclerosis Brother     No Known Allergies  Medication list has been reviewed and updated.  No current outpatient prescriptions on file prior to visit.   No current  facility-administered medications on file prior to visit.    Review of Systems:  As per HPI- otherwise negative.   Physical Examination: Filed Vitals:   04/12/15 1657  BP: 128/76  Pulse: 96  Temp: 98.5 F (36.9 C)  Resp: 16   Filed Vitals:   04/12/15 1657  Height: 5\' 4"  (1.626 m)  Weight: 148 lb (67.132 kg)   Body mass index is 25.39 kg/(m^2). Ideal Body Weight: Weight in (lb) to have BMI = 25: 145.3  GEN: WDWN, NAD, Non-toxic, A & O x 3, looks well HEENT: Atraumatic, Normocephalic. Neck supple. No masses, No LAD.  Bilateral TM wnl, oropharynx normal.  PEERL,EOMI.   Ears and Nose: No external deformity. CV: RRR, No M/G/R. No JVD. No thrill. No extra heart sounds. PULM: CTA B, no wheezes, crackles, rhonchi. No retractions. No resp. distress. No accessory muscle use. ABD: S, ND, +BS. No rebound. No HSM.  She notes minimal tenderness in the periumbilical area EXTR: No c/c/e NEURO Normal gait.  PSYCH: Normally interactive. Conversant. Not depressed or anxious appearing.  Calm demeanor.   Given 4mg  of zofran and felt better Assessment and Plan: Non-intractable vomiting with nausea, vomiting of unspecified type - Plan: POCT CBC, POCT urine pregnancy, Comprehensive metabolic panel, ondansetron (ZOFRAN-ODT) disintegrating tablet 4 mg, ondansetron (  ZOFRAN) 8 MG tablet  Here today with likely viral illness Treat with zofran prn, await the rest of her labs She will push fluids and eat a bland diet, let me know if not feeling better  Signed Abbe Amsterdam, MD  Received her CMP on 12/28 and gave her a call- she reports that she was able to eat some today and overall feels improved   Results for orders placed or performed in visit on 04/12/15  Comprehensive metabolic panel  Result Value Ref Range   Sodium 138 135 - 146 mmol/L   Potassium 3.8 3.5 - 5.3 mmol/L   Chloride 104 98 - 110 mmol/L   CO2 22 20 - 31 mmol/L   Glucose, Bld 79 65 - 99 mg/dL   BUN 9 7 - 25 mg/dL   Creat  0.98 (L) 1.19 - 1.10 mg/dL   Total Bilirubin 0.5 0.2 - 1.2 mg/dL   Alkaline Phosphatase 51 33 - 115 U/L   AST 29 10 - 35 U/L   ALT 28 6 - 29 U/L   Total Protein 7.1 6.1 - 8.1 g/dL   Albumin 4.2 3.6 - 5.1 g/dL   Calcium 8.4 (L) 8.6 - 10.2 mg/dL  POCT CBC  Result Value Ref Range   WBC 6.3 4.6 - 10.2 K/uL   Lymph, poc 1.4 0.6 - 3.4   POC LYMPH PERCENT 22.5 10 - 50 %L   MID (cbc) 0.2 0 - 0.9   POC MID % 3.1 0 - 12 %M   POC Granulocyte 4.7 2 - 6.9   Granulocyte percent 74.4 37 - 80 %G   RBC 4.24 4.04 - 5.48 M/uL   Hemoglobin 12.6 12.2 - 16.2 g/dL   HCT, POC 14.7 (A) 82.9 - 47.9 %   MCV 86.3 80 - 97 fL   MCH, POC 29.6 27 - 31.2 pg   MCHC 34.3 31.8 - 35.4 g/dL   RDW, POC 56.2 %   Platelet Count, POC 203 142 - 424 K/uL   MPV 7.8 0 - 99.8 fL  POCT urine pregnancy  Result Value Ref Range   Preg Test, Ur Negative Negative

## 2015-04-12 NOTE — Patient Instructions (Signed)
You can continue to use the zofran as needed for nausea Use tylenol as needed for your body aches  Rest, drink plenty of fluids and eat a bland diet- crackers, bananas, toast Let me know if you are not feeling much better in the next couple of days and I will be in touch with the rest of your labs

## 2015-04-13 LAB — COMPREHENSIVE METABOLIC PANEL
ALBUMIN: 4.2 g/dL (ref 3.6–5.1)
ALT: 28 U/L (ref 6–29)
AST: 29 U/L (ref 10–35)
Alkaline Phosphatase: 51 U/L (ref 33–115)
BUN: 9 mg/dL (ref 7–25)
CALCIUM: 8.4 mg/dL — AB (ref 8.6–10.2)
CHLORIDE: 104 mmol/L (ref 98–110)
CO2: 22 mmol/L (ref 20–31)
Creat: 0.44 mg/dL — ABNORMAL LOW (ref 0.50–1.10)
Glucose, Bld: 79 mg/dL (ref 65–99)
Potassium: 3.8 mmol/L (ref 3.5–5.3)
Sodium: 138 mmol/L (ref 135–146)
Total Bilirubin: 0.5 mg/dL (ref 0.2–1.2)
Total Protein: 7.1 g/dL (ref 6.1–8.1)

## 2015-05-11 ENCOUNTER — Telehealth: Payer: Self-pay | Admitting: *Deleted

## 2015-05-11 NOTE — Telephone Encounter (Signed)
Appointment on 06/08/15 @ 2:30pm with Dr.Steinheifer, left message for pt to call.

## 2015-05-11 NOTE — Telephone Encounter (Signed)
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  Thx

## 2015-05-11 NOTE — Telephone Encounter (Signed)
Claudia informed patient.  °

## 2015-06-08 ENCOUNTER — Telehealth: Payer: Self-pay | Admitting: *Deleted

## 2015-06-08 NOTE — Telephone Encounter (Signed)
Heather called from [facility] dermatology asking if labs could be faxed to her for referral on 05/11/15 telephone encounter. This was done at [phone]

## 2016-05-16 ENCOUNTER — Encounter: Payer: Self-pay | Admitting: Gynecology

## 2016-05-16 ENCOUNTER — Ambulatory Visit (INDEPENDENT_AMBULATORY_CARE_PROVIDER_SITE_OTHER): Payer: BLUE CROSS/BLUE SHIELD | Admitting: Gynecology

## 2016-05-16 VITALS — BP 120/78 | Ht 64.0 in | Wt 147.0 lb

## 2016-05-16 DIAGNOSIS — Z01419 Encounter for gynecological examination (general) (routine) without abnormal findings: Secondary | ICD-10-CM | POA: Diagnosis not present

## 2016-05-16 DIAGNOSIS — Z8639 Personal history of other endocrine, nutritional and metabolic disease: Secondary | ICD-10-CM

## 2016-05-16 DIAGNOSIS — N3281 Overactive bladder: Secondary | ICD-10-CM

## 2016-05-16 MED ORDER — TOLTERODINE TARTRATE ER 2 MG PO CP24: 2.0000 mg | ORAL_CAPSULE | Freq: Every day | ORAL | 11 refills | Status: DC

## 2016-05-16 NOTE — Patient Instructions (Signed)
Ejercicios de Kegel Licensed conveyancer) Los ejercicios de Kegel ayudan a fortalecer los msculos que sostienen el recto, la vagina, el intestino delgado, la vejiga y [place]. Los ejercicios de Kegel pueden ayudar a lo siguiente:  Mejorar el control de la vejiga y de los intestinos.  Mejorar la respuesta sexual.  Reducir los problemas o las molestias durante el [place]. Los ejercicios de Kegel implican apretar los msculos del suelo plvico, que son los mismos msculos que comprime cuando trata de TEFL teacher el flujo de la [place]. Los ejercicios se pueden Ecologist est sentado, parado o [place], pero lo mejor es variar la posicin. EJERCICIOS DE KEGEL 1. Apriete bien los msculos del suelo plvico. Debera sentir una elevacin firme en el rea del recto. Si es [place], tambin debera sentir una compresin en el rea de la vagina. Mantenga el [place], las nalgas y las piernas [address]. 2. Mantenga los msculos apretados durante 10segundos como mximo. 3. Relaje los msculos. Repita este ejercicio 50veces al da o como se lo haya indicado el mdico. Contine haciendo este ejercicio durante al menos 4 a 6semanas y Therapist, sports tiempo que le haya indicado el mdico. Esta informacin no tiene Theme park manager el consejo del mdico. Asegrese de hacerle al mdico cualquier pregunta que tenga. Document Released: 03/19/2012 Document Revised: 02/20/2015 Document Reviewed: 02/20/2015 Elsevier Interactive Patient Education  2017 Elsevier Inc. Vejiga hiperactiva en adultos (Overactive Bladder, Adult) El trastorno de vejiga hiperactiva es un grupo de sntomas urinarios. Si tiene vejiga hiperactiva, puede sentir la necesidad repentina de orinar de inmediato. Despus de sentir esta necesidad urgente, tambin puede tener prdida de orina si no puede llegar al bao con la rapidez suficiente (incontinencia urinaria). Estos sntomas pueden interferir con su trabajo diario y las [place].  Adems, los sntomas de vejiga hiperactiva pueden despertarlo durante la noche. El trastorno de vejiga hiperactiva afecta las seales nerviosas entre la vejiga y el cerebro. La vejiga puede recibir la seal de vaciarse antes de que est llena. Los msculos muy sensibles tambin pueden hacer que pierda orina demasiado pronto. CAUSAS Las causas de la vejiga hiperactiva pueden ser varias: Las causas posibles son las siguientes:  Infeccin urinaria.  Infeccin de los tejidos cercanos, como la prstata.  Agrandamiento de la prstata.  Estar embarazada de ms de un beb (embarazo mltiple).  Ciruga en el tero o la uretra.  Clculos en la vejiga, inflamacin o tumores.  Consumir cafena o alcohol en exceso.  Ciertos medicamentos, en especial lo que se toman para ayudar al organismo a eliminar el lquido extra (diurticos) al aumentar la produccin de [place].  Debilidad de los msculos y nervios, especialmente a causa de lo siguiente:  Lesin en la mdula espinal.  Ictus.  Esclerosis mltiple.  La enfermedad de Parkinson.  Diabetes. Esto puede producir un volumen de orina elevado que llena la vejiga tan rpido que la necesidad urgente de Geographical information systems officer se desencadena en forma muy intensa.  Estreimiento. La acumulacin de demasiada cantidad de heces puede ejercer presin en la vejiga. FACTORES DE RIESGO Puede correr un mayor riesgo de desarrollar vejiga hiperactiva si usted:  Es Psychiatrist.  Fuma.  Est atravesando la menopausia.  Tiene problemas de prstata.  Tiene una enfermedad neurolgica, como ictus, demencia, enfermedad de Parkinson o esclerosis mltiple (EM).  Ingiere alimentos o bebidas que irritan la vejiga. Entre ellos se incluyen el alcohol, los alimentos picantes y la cafena.  Tiene sobrepeso o es obeso. SIGNOS Y SNTOMAS Dynegy signos y los sntomas  de vejiga hiperactiva se incluyen los siguientes:  Urgencia repentina e intensa de orinar.  Prdida de  [place].  Orinar ocho o ms veces por da.  Despertarse dos o ms veces durante la noche para Geographical information systems officer. DIAGNSTICO El mdico puede sospechar la presencia de vejiga hiperactiva en funcin de los sntomas que [place]. El [employer] har un examen fsico y revisar su historia clnica. Pueden realizarle anlisis de [place] o de [place]. Por ejemplo, puede ser necesario realizar pruebas de la funcin de la vejiga para controlar la retencin de [place]. Es posible que tambin tenga que consultar a un mdico especialista en vas urinarias (urlogo). TRATAMIENTO El tratamiento para el trastorno de vejiga hiperactiva depende de la causa y la gravedad de su enfermedad. Ciertos tratamientos pueden Psychologist, educational del mdico o en la clnica. Tambin puede hacer cambios en su estilo de vida en su casa. Entre las opciones se incluyen las siguientes: Tratamientos Company secretary. El especialista utiliza sensores para ayudarlo a Theme park manager atento a las seales del cuerpo.  Llevar un registro diario de los momentos en que necesita orinar y qu sucede despus de la necesidad urgente de Geographical information systems officer. Esto puede ayudarlo a Passenger transport manager.  Entrenamiento de la vejiga. Esto lo ayuda a aprender a Scientist, physiological necesidad urgente de Geographical information systems officer al seguir un programa que lo obliga a Geographical information systems officer en intervalos regulares (vaciamiento cronometrado). Al principio, es posible que tenga que esperar unos minutos despus de sentir la necesidad urgente de Geographical information systems officer. Con el tiempo, debera poder [employer] con una hora de diferencia o ms.  Ejercicios de Kegel. Son ejercicios para fortalecer los msculos del piso plvico que sostienen la vejiga. La tonificacin de estos msculos puede ayudarlo a Chief Operating Officer las micciones, aun si hay hiperactividad en los msculos de la vejiga. Un especialista le ensear cmo hacer estos ejercicios en forma correcta. Deber practicarlos diariamente.  Prdida de peso. Si es obeso o  tiene sobrepeso, perder [employer] sntomas de vejiga hiperactiva. Hable con su mdico acerca de cmo perder peso y si hay algn programa o mtodo especfico ms eficaz para usted.  Cambio en la dieta. Esto podra ayudar si el estreimiento empeora el trastorno de vejiga hiperactiva. El mdico o nutricionista puede explicarle de qu forma puede hacer cambios en su dieta para Acupuncturist estreimiento. Tambin es posible que tenga que consumir menos cantidad de alcohol y cafena, y beber otros lquidos en distintos momentos del da.  Dejar de fumar.  Usar apsitos para Environmental health practitioner las prdidas mientras espera que otros tratamientos surtan [place]. Tratamientos fsicos   Estimulacin elctrica. Los electrodos envan pulsos elctricos suaves para [employer] nervios o los msculos que ayudan a Chief Operating Officer la vejiga. En algunos casos, los electrodos se colocan fuera del cuerpo. En otros casos, pueden colocarse en el interior del cuerpo (implante). Este tratamiento puede demorar varios meses en surtir [employer].  Dispositivos complementarios. Las mujeres pueden necesitar un dispositivo de plstico que calce en la vagina y sostenga la vejiga (pesario). Medicamentos  Varios medicamentos pueden ayudar a Engineer, production trastorno de vejiga hiperactiva y por lo general se utilizan junto con otros medicamentos. Algunos se inyectan en los msculos que participan en la miccin. Otros vienen en comprimidos. El mdico tambin puede indicarle lo siguiente:  Antiespasmdicos. Estos medicamentos bloquean las seales que los nervios envan a la vejiga. Esto evita que la vejiga elimine orina en el momento incorrecto.  Antidepresivos tricclicos. Estos tipos de antidepresivos tambin [employer] de la  vejiga. Ciruga   Puede implantarse un dispositivo que ayuda a Chief Operating Officercontrolar las seales nerviosas que indican cundo debe orinar.  Puede someterse a una ciruga de implante de electrodos para recibir Agricultural engineeruna  estimulacin elctrica.  A veces, los casos muy graves de vejiga hiperactiva requieren de una ciruga para cambiar la forma de la vejiga. INSTRUCCIONES PARA EL CUIDADO EN EL HOGAR  Tome los medicamentos solamente como se lo haya indicado el mdico.  Use los implantes o un pesario como se lo haya indicado el mdico.  Modifique su dieta o estilo de vida como se lo haya recomendado su mdico. Estos pueden incluir los siguientes:  Product managerBeber menos cantidad de lquido o beber en distintos momentos del Futures traderda. Si necesita orinar con frecuencia por las noches, es posible que tenga que dejar de beber lquidos apenas comienza la noche.  Reduzca la ingesta de cafena o alcohol. Ambos pueden empeorar el trastorno de vejiga hiperactiva. La cafena se encuentra en el caf, el t y los refrescos.  Haga ejercicios de Kegel para fortalecer los msculos.  Pierda peso si lo necesita.  Ingiera una dieta saludable y equilibrada para Multimedia programmerevitar el estreimiento.  Lleve un diario o libro de anotaciones para registrar la cantidad de lquidos que ingiere y cundo lo hace, y [place]in cundo siente necesidad de Geographical information systems officerorinar. Esto ayudar a su mdico a Estate manager/land agentcontrolar la enfermedad. SOLICITE ATENCIN MDICA SI:  Los sntomas no mejoran despus de Medical illustratorrealizar el tratamiento.  El dolor y las molestias [place].  Tiene necesidad urgente de orinar con mayor frecuencia.  Tiene fiebre. SOLICITE ATENCIN MDICA DE INMEDIATO SI: No puede controlar la vejiga para nada. Esta informacin no tiene Theme park managercomo fin reemplazar el consejo del mdico. Asegrese de hacerle al mdico cualquier pregunta que tenga. Document Released: 03/19/2012 Document Revised: 04/23/2014 Document Reviewed: 08/26/2013 Elsevier Interactive Patient Education  2017 [employer].

## 2016-05-16 NOTE — Progress Notes (Signed)
Kimberly Mccann 12-24-1970 [id number]   History:    46 y.o.  for annual gyn exam who has not been seen the office since 2016. Back then she was diagnosed with overactive bladder but could not afford the medication and she continues with the symptoms. Patient reports normal menstrual cycles. Several years ago she had a ParaGard T380A IUD.Marland KitchenReview of patient's old record from our office indicated that she had history in the past of hyperprolactinemia whereby she had been on Dostinex 0.25 mg twice a week. She is no longer on medication. She denies any unusual headaches blurry vision or nipple discharge. 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 Patient states that she gets up twice in the night to urinate and done today she urinates frequently and at times feels like she hasn't voided completely and has to return back to urinate again. She denies any urinary incontinence.  Past medical history,surgical history, family history and social history were all reviewed and documented in the EPIC chart.  Gynecologic History Patient's last menstrual period was 05/02/2016 (approximate). Contraception: IUD Last Pap: 2016. Results were: normal Last mammogram: 2016. Results were: normal  Obstetric History OB History  Gravida Para Term Preterm AB Living  4 3     1 3   SAB TAB Ectopic Multiple Live Births  1            # Outcome Date GA Lbr Len/2nd Weight Sex Delivery Anes PTL Lv  4 SAB           3 Para           2 Para           1 Para                ROS: A ROS was performed and pertinent positives and negatives are included in the history.  GENERAL: No fevers or chills. HEENT: No change in vision, no earache, sore throat or sinus congestion. NECK: No pain or stiffness. CARDIOVASCULAR: No chest pain or pressure. No palpitations. PULMONARY: No shortness of breath, cough or wheeze. GASTROINTESTINAL: No abdominal pain, nausea, vomiting or  diarrhea, melena or bright red blood per rectum. GENITOURINARY: No urinary frequency, urgency, hesitancy or dysuria. MUSCULOSKELETAL: No joint or muscle pain, no back pain, no recent trauma. DERMATOLOGIC: No rash, no itching, no lesions. ENDOCRINE: No polyuria, polydipsia, no heat or cold intolerance. No recent change in weight. HEMATOLOGICAL: No anemia or easy bruising or bleeding. NEUROLOGIC: No headache, seizures, numbness, tingling or weakness. PSYCHIATRIC: No depression, no loss of interest in normal activity or change in sleep pattern.     Exam: chaperone present  BP 120/78   Ht 5\' 4"  (1.626 m)   Wt 147 lb (66.7 kg)   LMP 05/02/2016 (Approximate)   BMI 25.23 kg/m   Body mass index is 25.23 kg/m.  General appearance : Well developed well nourished female. No acute distress HEENT: Eyes: no retinal hemorrhage or exudates,  Neck supple, trachea midline, no carotid bruits, no thyroidmegaly Lungs: Clear to auscultation, no rhonchi or wheezes, or rib retractions  Heart: Regular rate and rhythm, no murmurs or gallops Breast:Examined in sitting and supine position were symmetrical in appearance, no palpable masses or tenderness,  no skin retraction, no nipple inversion, no nipple discharge, no skin discoloration, no axillary or supraclavicular lymphadenopathy Abdomen: no palpable masses or tenderness, no rebound or guarding Extremities: no edema or skin discoloration or tenderness  Pelvic:  Bartholin, Urethra, Skene Glands: Within normal limits             Vagina: No gross lesions or discharge  Cervix: No gross lesions or discharge, IUD string visualized  Uterus  anteverted, normal size, shape and consistency, non-tender and mobile  Adnexa  Without masses or tenderness  Anus and perineum  normal   Rectovaginal  normal sphincter tone without palpated masses or tenderness             Hemoccult not indicated   No evidence of cystocele, rectocele or uterine prolapse. Patient had good anal  wink. On Valsalva in the supine and erect position there was no leakage of urine.  Assessment/Plan:  46 y.o. 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 We discussed different treatment options she will be given instructions on Keagle exercises and will be prescribed Detrol LA 2 mg one by mouth daily every morning. The risks benefits and pros and cons were discussed. Patient with no history of glaucoma report a history of hypertension. Because of her past history of hyperprolactinemia when she returns next week for her fasting blood work we are going to check her prolactin level as well. Her fasting screening blood work will be the following: Fasting lipid profile, comprehensive metabolic panel, TSH, CBC, and urinalysis. Pap smear not done this year. Requisition for patient to schedule her mammogram was provided.  An additional 10 minutes was spent discussing different treatment options for overactive bladder and assessment.   Ok EdwardsFERNANDEZ,JUAN H MD, 2:37 PM 05/16/2016

## 2016-05-17 DIAGNOSIS — E559 Vitamin D deficiency, unspecified: Secondary | ICD-10-CM

## 2016-05-17 HISTORY — DX: Vitamin D deficiency, unspecified: E55.9

## 2016-05-17 LAB — URINALYSIS W MICROSCOPIC + REFLEX CULTURE
BACTERIA UA: NONE SEEN [HPF]
BILIRUBIN URINE: NEGATIVE
Casts: NONE SEEN [LPF]
Crystals: NONE SEEN [HPF]
GLUCOSE, UA: NEGATIVE
Hgb urine dipstick: NEGATIVE
Ketones, ur: NEGATIVE
LEUKOCYTES UA: NEGATIVE
NITRITE: NEGATIVE
PH: 6 (ref 5.0–8.0)
PROTEIN: NEGATIVE
RBC / HPF: NONE SEEN RBC/HPF (ref <=2)
SPECIFIC GRAVITY, URINE: 1.008 (ref 1.001–1.035)
Squamous Epithelial / LPF: NONE SEEN [HPF] (ref <=5)
WBC UA: NONE SEEN WBC/HPF (ref <=5)
YEAST: NONE SEEN [HPF]

## 2016-05-23 ENCOUNTER — Other Ambulatory Visit: Payer: BLUE CROSS/BLUE SHIELD

## 2016-05-23 LAB — COMPREHENSIVE METABOLIC PANEL
ALT: 15 U/L (ref 6–29)
AST: 21 U/L (ref 10–35)
Albumin: 4.2 g/dL (ref 3.6–5.1)
Alkaline Phosphatase: 44 U/L (ref 33–115)
BILIRUBIN TOTAL: 0.5 mg/dL (ref 0.2–1.2)
BUN: 10 mg/dL (ref 7–25)
CHLORIDE: 106 mmol/L (ref 98–110)
CO2: 19 mmol/L — AB (ref 20–31)
CREATININE: 0.61 mg/dL (ref 0.50–1.10)
Calcium: 9 mg/dL (ref 8.6–10.2)
GLUCOSE: 87 mg/dL (ref 65–99)
Potassium: 4.4 mmol/L (ref 3.5–5.3)
SODIUM: 140 mmol/L (ref 135–146)
Total Protein: 7.5 g/dL (ref 6.1–8.1)

## 2016-05-23 LAB — CBC WITH DIFFERENTIAL/PLATELET
Basophils Absolute: 0 cells/uL (ref 0–200)
Basophils Relative: 0 %
EOS PCT: 1 %
Eosinophils Absolute: 56 cells/uL (ref 15–500)
HCT: 37.9 % (ref 35.0–45.0)
Hemoglobin: 12.4 g/dL (ref 11.7–15.5)
Lymphocytes Relative: 36 %
Lymphs Abs: 2016 cells/uL (ref 850–3900)
MCH: 29.4 pg (ref 27.0–33.0)
MCHC: 32.7 g/dL (ref 32.0–36.0)
MCV: 89.8 fL (ref 80.0–100.0)
MONOS PCT: 6 %
MPV: 10.9 fL (ref 7.5–12.5)
Monocytes Absolute: 336 cells/uL (ref 200–950)
NEUTROS PCT: 57 %
Neutro Abs: 3192 cells/uL (ref 1500–7800)
Platelets: 249 10*3/uL (ref 140–400)
RBC: 4.22 MIL/uL (ref 3.80–5.10)
RDW: 13.5 % (ref 11.0–15.0)
WBC: 5.6 10*3/uL (ref 3.8–10.8)

## 2016-05-23 LAB — LIPID PANEL
Cholesterol: 167 mg/dL (ref <200)
HDL: 65 mg/dL (ref >50)
LDL CALC: 88 mg/dL (ref <100)
Total CHOL/HDL Ratio: 2.6 Ratio (ref <5.0)
Triglycerides: 71 mg/dL (ref <150)
VLDL: 14 mg/dL (ref <30)

## 2016-05-24 ENCOUNTER — Encounter: Payer: Self-pay | Admitting: Gynecology

## 2016-05-24 ENCOUNTER — Other Ambulatory Visit: Payer: Self-pay | Admitting: Anesthesiology

## 2016-05-24 DIAGNOSIS — E559 Vitamin D deficiency, unspecified: Secondary | ICD-10-CM

## 2016-05-24 LAB — PROLACTIN: Prolactin: 19.9 ng/mL

## 2016-05-24 LAB — VITAMIN D 25 HYDROXY (VIT D DEFICIENCY, FRACTURES): Vit D, 25-Hydroxy: 22 ng/mL — ABNORMAL LOW (ref 30–100)

## 2016-05-24 MED ORDER — VITAMIN D (ERGOCALCIFEROL) 1.25 MG (50000 UNIT) PO CAPS: 50000.0000 [IU] | ORAL_CAPSULE | ORAL | 0 refills | Status: DC

## 2016-05-28 ENCOUNTER — Other Ambulatory Visit: Payer: Self-pay | Admitting: Gynecology

## 2016-05-28 DIAGNOSIS — Z1231 Encounter for screening mammogram for malignant neoplasm of breast: Secondary | ICD-10-CM

## 2016-06-12 ENCOUNTER — Ambulatory Visit
Admission: RE | Admit: 2016-06-12 | Discharge: 2016-06-12 | Disposition: A | Discharge status: Home / Self Care | Payer: BLUE CROSS/BLUE SHIELD | Source: Ambulatory Visit | Attending: Gynecology | Admitting: Gynecology

## 2016-06-12 DIAGNOSIS — Z1231 Encounter for screening mammogram for malignant neoplasm of breast: Secondary | ICD-10-CM

## 2016-08-06 ENCOUNTER — Telehealth: Payer: Self-pay | Admitting: *Deleted

## 2016-08-06 NOTE — Telephone Encounter (Signed)
PreVisit Call attempted.

## 2016-08-07 ENCOUNTER — Encounter: Payer: Self-pay | Admitting: Family Medicine

## 2016-08-07 ENCOUNTER — Ambulatory Visit (INDEPENDENT_AMBULATORY_CARE_PROVIDER_SITE_OTHER): Payer: BLUE CROSS/BLUE SHIELD | Admitting: Family Medicine

## 2016-08-07 ENCOUNTER — Encounter: Payer: Self-pay | Admitting: Surgical

## 2016-08-07 VITALS — BP 116/78 | HR 85 | Temp 98.0°F | Ht 64.0 in | Wt 153.2 lb

## 2016-08-07 DIAGNOSIS — J0101 Acute recurrent maxillary sinusitis: Secondary | ICD-10-CM

## 2016-08-07 MED ORDER — AMOXICILLIN 875 MG PO TABS: 875.0000 mg | ORAL_TABLET | Freq: Two times a day (BID) | ORAL | 0 refills | Status: DC

## 2016-08-07 MED ORDER — PREDNISONE 5 MG PO TABS: ORAL_TABLET | ORAL | 0 refills | Status: DC

## 2016-08-07 NOTE — Progress Notes (Signed)
Pre visit review using our clinic review tool, if applicable. No additional management support is needed unless otherwise documented below in the visit note. 

## 2016-08-07 NOTE — Progress Notes (Signed)
Kimberly Mccann is a 46 y.o. female is here to [facility].   History of Present Illness:   Britt Bottom CMA acting as scribe for Dr. Earlene Plater.  Sinusitis  This is a new problem. The current episode started 1 to 4 weeks ago. The problem has been gradually worsening since onset. There has been no fever. The pain is moderate. Associated symptoms include congestion, coughing, headaches, neck pain and a sore throat. Pertinent negatives include no chills. Past treatments include oral decongestants and acetaminophen. The treatment provided mild relief.   Health Maintenance Due  Topic Date Due  . HIV Screening  06/22/1984   PMHx, SurgHx, SocialHx, Medications, and Allergies were reviewed in the Visit Navigator and updated as appropriate.   Past Medical History:  Diagnosis Date  . Anemia   . Dysplasia of cervix   . Hyperprolactinemia (HCC)   . Overactive bladder   . Vitamin D deficiency 05/2016    Past Surgical History:  Procedure Laterality Date  . CHOLECYSTECTOMY      Family History  Problem Relation Age of Onset  . Multiple sclerosis Brother    Social History  Substance Use Topics  . Smoking status: Never Smoker  . Smokeless tobacco: Never Used  . Alcohol use No   Current Medications and Allergies:   .  acetaminophen (TYLENOL) 500 MG tablet, Take 500 mg by mouth every 6 (six) hours as needed., Disp: , Rfl:  .  tolterodine (DETROL LA) 2 MG 24 hr capsule, Take 1 capsule (2 mg total) by mouth daily. (Patient not taking: Reported on 08/07/2016), Disp: 30 capsule, Rfl: 11  No Known Allergies   Review of Systems:   Review of Systems  Constitutional: Positive for malaise/fatigue. Negative for chills and fever.  HENT: Positive for congestion, sinus pain and sore throat.        X 2 weeks.  Eyes: Negative for blurred vision and double vision.  Respiratory: Positive for cough. Negative for wheezing.   Cardiovascular: Negative for chest pain, palpitations and leg swelling.    Gastrointestinal: Positive for nausea. Negative for abdominal pain and vomiting.  Genitourinary: Negative for dysuria.  Musculoskeletal: Positive for neck pain. Negative for back pain and joint pain.  Neurological: Positive for dizziness and headaches.  Psychiatric/Behavioral: Negative for depression, hallucinations and memory loss.    Vitals:   Vitals:   08/07/16 1331  BP: 116/78  Pulse: 85  Temp: 98 F (36.7 C)  TempSrc: Oral  SpO2: 99%  Weight: 153 lb 3.2 oz (69.5 kg)  Height:  (1.626 m)     Body mass index is 26.3 kg/m.  Physical Exam:   Physical Exam  Constitutional: She appears well-nourished.  HENT:  Head: Normocephalic and atraumatic.  Nose: Rhinorrhea present. Right sinus exhibits maxillary sinus tenderness and frontal sinus tenderness. Left sinus exhibits maxillary sinus tenderness and frontal sinus tenderness.  Eyes: EOM are normal. Pupils are equal, round, and reactive to light.  Neck: Normal range of motion. Neck supple.  Cardiovascular: Normal rate, regular rhythm, normal heart sounds and intact distal pulses.   Pulmonary/Chest: Effort normal.  Abdominal: Soft.  Skin: Skin is warm.  Psychiatric: She has a normal mood and affect. Her behavior is normal.  Nursing note and vitals reviewed.    Assessment and Plan:    Kimberly Mccann was seen today for establish care, headache and sinus problem.  Diagnoses and all orders for this visit:  Acute recurrent maxillary sinusitis -     amoxicillin (AMOXIL) 875 MG  tablet; Take 1 tablet (875 mg total) by mouth 2 (two) times daily. -     predniSONE (DELTASONE) 5 MG tablet; 6-5-4-3-2-1-OFF   . Reviewed expectations re: course of current medical issues. . Discussed self-management of symptoms. . Outlined signs and symptoms indicating need for more acute intervention. . Patient verbalized understanding and all questions were answered. . See orders for this visit as documented in the electronic medical  record. . Patient received an After Visit Summary.  Records requested if needed. I spent 20 minutes with this patient, greater than 50% was face-to-face time counseling regarding the above diagnoses.  CMA served as Neurosurgeon during this visit. History, Physical, and Plan performed by medical provider. Documentation and orders reviewed and attested to. Helane Rima, D.O.  Helane Rima, D.O. [place] 08/08/2016   Follow-up: No Follow-up on file.  Meds ordered this encounter  Medications  . acetaminophen (TYLENOL) 500 MG tablet    Sig: Take 500 mg by mouth every 6 (six) hours as needed.  Marland Kitchen amoxicillin (AMOXIL) 875 MG tablet    Sig: Take 1 tablet (875 mg total) by mouth 2 (two) times daily.    Dispense:  20 tablet    Refill:  0  . predniSONE (DELTASONE) 5 MG tablet    Sig: 6-5-4-3-2-1-OFF    Dispense:  21 tablet    Refill:  0   Medications Discontinued During This Encounter  Medication Reason  . Vitamin D, Ergocalciferol, (DRISDOL) 50000 units CAPS capsule Error  . tolterodine (DETROL LA) 2 MG 24 hr capsule    No orders of the defined types were placed in this encounter.

## 2016-08-29 ENCOUNTER — Encounter: Payer: Self-pay | Admitting: Gynecology

## 2016-09-06 ENCOUNTER — Encounter: Payer: Self-pay | Admitting: Family Medicine

## 2016-09-06 ENCOUNTER — Ambulatory Visit (INDEPENDENT_AMBULATORY_CARE_PROVIDER_SITE_OTHER): Payer: BLUE CROSS/BLUE SHIELD | Admitting: Family Medicine

## 2016-09-06 ENCOUNTER — Telehealth: Payer: Self-pay | Admitting: Family Medicine

## 2016-09-06 VITALS — BP 130/82 | Temp 97.9°F | Ht 64.0 in | Wt 152.2 lb

## 2016-09-06 DIAGNOSIS — J309 Allergic rhinitis, unspecified: Secondary | ICD-10-CM

## 2016-09-06 DIAGNOSIS — J0101 Acute recurrent maxillary sinusitis: Secondary | ICD-10-CM | POA: Diagnosis not present

## 2016-09-06 MED ORDER — AZITHROMYCIN 250 MG PO TABS: ORAL_TABLET | ORAL | 0 refills | Status: DC

## 2016-09-06 MED ORDER — PREDNISONE 20 MG PO TABS: ORAL_TABLET | ORAL | 0 refills | Status: DC

## 2016-09-06 MED ORDER — CETIRIZINE HCL 10 MG PO TABS: 10.0000 mg | ORAL_TABLET | Freq: Every day | ORAL | 1 refills | Status: DC

## 2016-09-06 MED ORDER — FLUTICASONE PROPIONATE 50 MCG/ACT NA SUSP: 2.0000 | Freq: Every day | NASAL | 6 refills | Status: DC

## 2016-09-06 NOTE — Telephone Encounter (Signed)
Patient called stating she needs a note for work tomorrow. Patient said she will come by and pick this up in the morning.

## 2016-09-06 NOTE — Progress Notes (Signed)
Kimberly Mccann is a 46 y.o. female here for an acute visit.  History of Present Illness:   Insurance claims handlerAmber Agner, CMA, acting as scribe for Dr. Earlene PlaterWallace.  Sinusitis  This is a recurrent problem. The current episode started in the past 7 days. The problem has been gradually worsening since onset. There has been no fever. Associated symptoms include congestion, coughing, headaches, sinus pressure and a sore throat. Pertinent negatives include no chills, ear pain, neck pain or shortness of breath. Past treatments include acetaminophen. The treatment provided no relief.   PMHx, SurgHx, SocialHx, Medications, and Allergies were reviewed in the Visit Navigator and updated as appropriate.  Current Medications:   .  cetirizine (ZYRTEC) 10 MG tablet, Take 1 tablet (10 mg total) by mouth daily., Disp: 30 tablet, Rfl: 1 .  fluticasone (FLONASE) 50 MCG/ACT nasal spray, Place 2 sprays into both nostrils daily., Disp: 16 g, Rfl: 6   No Known Allergies   Review of Systems:   Review of Systems  Constitutional: Negative for chills and fever.  HENT: Positive for congestion, sinus pain, sinus pressure and sore throat. Negative for ear pain.   Eyes: Negative for blurred vision and pain.  Respiratory: Positive for cough and sputum production. Negative for shortness of breath.   Cardiovascular: Negative for chest pain and palpitations.  Gastrointestinal: Negative for abdominal pain, nausea and vomiting.  Genitourinary: Negative for frequency.  Musculoskeletal: Negative for back pain and neck pain.  Skin: Negative for rash.  Neurological: Positive for headaches. Negative for dizziness and loss of consciousness.  Psychiatric/Behavioral: Negative for depression. The patient is not nervous/anxious.     Vitals:   Vitals:   09/06/16 0949  BP: 130/82  Temp: 97.9 F (36.6 C)  TempSrc: Oral  Weight: 152 lb 3.2 oz (69 kg)  Height: 5\' 4"  (1.626 m)     Body mass index is 26.13 kg/m.  Physical Exam:    Physical Exam  Constitutional: She appears well-nourished.  HENT:  Head: Normocephalic and atraumatic.  Nose: Rhinorrhea present. Right sinus exhibits maxillary sinus tenderness. Left sinus exhibits maxillary sinus tenderness.  Eyes: EOM are normal. Pupils are equal, round, and reactive to light.  Neck: Normal range of motion. Neck supple.  Cardiovascular: Normal rate, regular rhythm, normal heart sounds and intact distal pulses.   Pulmonary/Chest: Effort normal.  Abdominal: Soft.  Skin: Skin is warm.  Psychiatric: She has a normal mood and affect. Her behavior is normal.  Nursing note and vitals reviewed.   Assessment and Plan:   [name] was seen today for acute visit and sinusitis.  Diagnoses and all orders for this visit:  Acute recurrent maxillary sinusitis -     azithromycin (ZITHROMAX Z-PAK) 250 MG tablet; Take 2 tablets on day one, then 1 tablet daily for the following 4 days -     predniSONE (DELTASONE) 20 MG tablet; Take 1 tablet daily for 3 days, then stop  Allergic rhinitis, unspecified seasonality, unspecified trigger -     fluticasone (FLONASE) 50 MCG/ACT nasal spray; Place 2 sprays into both nostrils daily. -     cetirizine (ZYRTEC) 10 MG tablet; Take 1 tablet (10 mg total) by mouth daily.    . Reviewed expectations re: course of current medical issues. . Discussed self-management of symptoms. . Outlined signs and symptoms indicating need for more acute intervention. . Patient verbalized understanding and all questions were answered. Kimberly Mccann. Health Maintenance issues including appropriate healthy diet, exercise, and smoking avoidance were discussed with patient. . See orders  for this visit as documented in the electronic medical record. . Patient received an After Visit Summary.  CMA served as Neurosurgeon during this visit. History, Physical, and Plan performed by medical provider. The above documentation has been reviewed and is accurate and complete. Kimberly Mccann,  D.O.  Kimberly Rima, DO [place] 09/07/2016  No future appointments.

## 2016-09-07 NOTE — Telephone Encounter (Signed)
Per Dr. Earlene PlaterWallace, ok to write work note.  Note written and placed at front desk for pick up.  Patient notified.

## 2016-09-07 NOTE — Telephone Encounter (Signed)
Ok to write work note for today?

## 2016-10-26 ENCOUNTER — Other Ambulatory Visit: Payer: BLUE CROSS/BLUE SHIELD

## 2016-10-26 DIAGNOSIS — E559 Vitamin D deficiency, unspecified: Secondary | ICD-10-CM

## 2016-10-27 LAB — VITAMIN D 25 HYDROXY (VIT D DEFICIENCY, FRACTURES): Vit D, 25-Hydroxy: 21 ng/mL — ABNORMAL LOW (ref 30–100)

## 2016-10-30 ENCOUNTER — Other Ambulatory Visit: Payer: Self-pay | Admitting: Anesthesiology

## 2016-10-30 DIAGNOSIS — E559 Vitamin D deficiency, unspecified: Secondary | ICD-10-CM

## 2016-10-30 MED ORDER — VITAMIN D (ERGOCALCIFEROL) 1.25 MG (50000 UNIT) PO CAPS: 50000.0000 [IU] | ORAL_CAPSULE | ORAL | 0 refills | Status: DC

## 2016-11-12 ENCOUNTER — Telehealth: Payer: Self-pay | Admitting: *Deleted

## 2016-11-12 MED ORDER — VITAMIN D (ERGOCALCIFEROL) 1.25 MG (50000 UNIT) PO CAPS: 50000.0000 [IU] | ORAL_CAPSULE | ORAL | 0 refills | Status: DC

## 2016-11-12 NOTE — Telephone Encounter (Signed)
Rx sent 

## 2016-11-12 NOTE — Telephone Encounter (Signed)
-----   Message from Jerilynn Mageslaudia Shaffer sent at 11/12/2016 12:13 PM EDT ----- Regarding: RX Vit D RX got sent to wrong pharmacy. [facility] [address] Thx

## 2017-01-08 ENCOUNTER — Encounter: Payer: Self-pay | Admitting: Family Medicine

## 2017-01-08 ENCOUNTER — Ambulatory Visit (INDEPENDENT_AMBULATORY_CARE_PROVIDER_SITE_OTHER): Payer: BLUE CROSS/BLUE SHIELD | Admitting: Family Medicine

## 2017-01-08 ENCOUNTER — Telehealth: Payer: Self-pay | Admitting: Family Medicine

## 2017-01-08 VITALS — BP 140/82 | HR 88 | Ht 64.0 in | Wt 152.4 lb

## 2017-01-08 DIAGNOSIS — R002 Palpitations: Secondary | ICD-10-CM

## 2017-01-08 DIAGNOSIS — R42 Dizziness and giddiness: Secondary | ICD-10-CM

## 2017-01-08 LAB — POCT URINALYSIS DIPSTICK
BILIRUBIN UA: NEGATIVE
Blood, UA: NEGATIVE
Glucose, UA: NEGATIVE
KETONES UA: NEGATIVE
LEUKOCYTES UA: NEGATIVE
NITRITE UA: NEGATIVE
PH UA: 8.5 — AB (ref 5.0–8.0)
Protein, UA: NEGATIVE
Spec Grav, UA: 1.015 (ref 1.010–1.025)
Urobilinogen, UA: 0.2 E.U./dL

## 2017-01-08 NOTE — Telephone Encounter (Signed)
Noted. Dr. Jimmey Ralph is aware of appointment

## 2017-01-08 NOTE — Telephone Encounter (Signed)
Patient called in stating she has been feeling dizzy and having headaches for about a week now. Sent patient to triage. Awaiting note from triage.

## 2017-01-08 NOTE — Patient Instructions (Addendum)
You are probably a little dehydrated. Make sure you drink plenty of water over the next few days.  We will check blood work and a urine sample today.  We will call you with the results of these tests in 1-2 days.  Take care,  Dr Jimmey Ralph   How to Perform the Epley Maneuver The Epley maneuver is an exercise that relieves symptoms of vertigo. Vertigo is the feeling that you or your surroundings are moving when they are not. When you feel vertigo, you may feel like the room is spinning and have trouble walking. Dizziness is a little different than vertigo. When you are dizzy, you may feel unsteady or light-headed. You can do this maneuver at home whenever you have symptoms of vertigo. You can do it up to 3 times a day until your symptoms go away. Even though the Epley maneuver may relieve your vertigo for a few weeks, it is possible that your symptoms will return. This maneuver relieves vertigo, but it does not relieve dizziness. What are the risks? If it is done correctly, the Epley maneuver is considered safe. Sometimes it can lead to dizziness or nausea that goes away after a short time. If you develop other symptoms, such as changes in vision, weakness, or numbness, stop doing the maneuver and call your health care provider. How to perform the Epley maneuver 1. Sit on the edge of a bed or table with your back straight and your legs extended or hanging over the edge of the bed or table. 2. Turn your head halfway toward the affected ear or side. 3. Lie backward quickly with your head turned until you are lying flat on your back. You may want to position a pillow under your shoulders. 4. Hold this position for 30 seconds. You may experience an attack of vertigo. This is normal. 5. Turn your head to the opposite direction until your unaffected ear is facing the floor. 6. Hold this position for 30 seconds. You may experience an attack of vertigo. This is normal. Hold this position until the vertigo  stops. 7. Turn your whole body to the same side as your head. Hold for another 30 seconds. 8. Sit back up. You can repeat this exercise up to 3 times a day. Follow these instructions at home:  After doing the Epley maneuver, you can return to your normal activities.  Ask your health care provider if there is anything you should do at home to prevent vertigo. He or she may recommend that you: ? Keep your head raised (elevated) with two or more pillows while you sleep. ? Do not sleep on the side of your affected ear. ? Get up slowly from bed. ? Avoid sudden movements during the day. ? Avoid extreme head movement, like looking up or bending over. Contact a health care provider if:  Your vertigo gets worse.  You have other symptoms, including: ? Nausea. ? Vomiting. ? Headache. Get help right away if:  You have vision changes.  You have a severe or worsening headache or neck pain.  You cannot stop vomiting.  You have new numbness or weakness in any part of your body. Summary  Vertigo is the feeling that you or your surroundings are moving when they are not.  The Epley maneuver is an exercise that relieves symptoms of vertigo.  If the Epley maneuver is done correctly, it is considered safe. You can do it up to 3 times a day. This information is not intended to  replace advice given to you by your health care provider. Make sure you discuss any questions you have with your health care provider. Document Released: 04/07/2013 Document Revised: 02/21/2016 Document Reviewed: 02/21/2016 Elsevier Interactive Patient Education  2017 [employer].

## 2017-01-08 NOTE — Progress Notes (Signed)
     Subjective:  Kimberly Mccann is a 46 y.o. female who presents today with a chief complaint of headache and dizziness.   HPI:  Headache and Dizziness Symptoms started about a week and a half ago. Headache is described as a band like pressure across her forehead. No weakness or numbness. She describes the dizziness as room spinning sensation. No fevers or chills. No congestion. No current headache.   Yesterday had about a 5 minute episode of chest pressure with palpitations. She has not had a recurrence since then. She had an episode of palpitation several years ago with a normal work up.   ROS: Per HPI  PMH: Smoking history reviewed. Never smoker.   Objective:  Physical Exam: BP 140/82   Pulse 88   Ht  (1.626 m)   Wt 152 lb 6.4 oz (69.1 kg)   SpO2 99%   BMI 26.16 kg/m   Orthostatic VS for the past 24 hrs (Last 3 readings):  BP- Lying BP- Sitting BP- Standing at 0 minutes  01/08/17 1635 120/80 140/90 130/80   Gen: NAD, resting comfortably HEENT: TMs clear bilaterally. No thyromegally noted.  CV: RRR with no murmurs appreciated Pulm: NWOB, CTAB with no crackles, wheezes, or rhonchi Neuro: CN2-12 intact. Strength 5/5 throughout. Sensation to light touch intact. Mildly positive Dix-Hallpike on right.   EKG: Normal sinus rhythm. No acute ischemic changes.   Assessment/Plan:  Dizziness Likely secondary to dehydration. Her specific gravity on her UA is mildly elevated. Her neurological exam is normal. Her EKG is normal. Her orthostatic vital signs are borderline. She did have a mildly positive Dix-Hallpike on the right, so she may have underlying BPPV. Will check CBC, CMET, TSH. Discussed Epley maneuvers with patient. Encouraged PO hydration. Discussed return precautions. Follow up in in 5-7 days if not improving.   Palpitations Again, likely secondary to dehydration. Her EKG was normal. Will check TSH. Given that it was an isolated event, do not think she requires further  workup at this point. Discussed return precautions with patient. If symptoms recur consider referral to cardiology.  Katina Degree. Jimmey Ralph, MD 01/09/2017 8:18 AM

## 2017-01-08 NOTE — Telephone Encounter (Signed)
Patient Name: Kimberly Mccann  DOB: 15-Dec-1970    Initial Comment [name] states she has been experiencing dizziness    Nurse Assessment  Nurse: Stefano Gaul, RN, Dwana Curd Date/Time (Eastern Time): 01/08/2017 2:39:29 PM  Confirm and document reason for call. If symptomatic, describe symptoms. ---[name] states she is experiencing dizziness. She is lightheaded for about a week. Has chest pressure that last about 5 min yesterday. No pain now.  Does the patient have any new or worsening symptoms? ---Yes  Will a triage be completed? ---Yes  Related visit to physician within the last 2 weeks? ---No  Does the PT have any chronic conditions? (i.e. diabetes, asthma, etc.) ---No  Is the patient pregnant or possibly pregnant? (Ask all females between the ages of 47-55) ---No  Is this a behavioral health or substance abuse call? ---No     Guidelines    Guideline Title Affirmed Question Affirmed Notes  Chest Pain [1] Chest pain lasting <= 5 minutes AND [2] NO chest pain or cardiac symptoms now (Exceptions: pains lasting a few seconds)    Final Disposition User   See Physician within 24 Hours May, RN, Vera    Comments  appt scheduled for 01/08/2017 at 4;15 pm with Dr. Jacquiline Doe   Referrals  REFERRED TO PCP OFFICE   [name] Disagree/Comply Comply  [name] Understands Yes  PreDisposition Call Doctor

## 2017-01-09 LAB — CBC
HCT: 35.5 % — ABNORMAL LOW (ref 36.0–46.0)
Hemoglobin: 11.7 g/dL — ABNORMAL LOW (ref 12.0–15.0)
MCHC: 33 g/dL (ref 30.0–36.0)
MCV: 89.4 fl (ref 78.0–100.0)
PLATELETS: 244 10*3/uL (ref 150.0–400.0)
RBC: 3.97 Mil/uL (ref 3.87–5.11)
RDW: 13.1 % (ref 11.5–15.5)
WBC: 5.9 10*3/uL (ref 4.0–10.5)

## 2017-01-09 LAB — BASIC METABOLIC PANEL
BUN: 10 mg/dL (ref 6–23)
CO2: 27 mEq/L (ref 19–32)
Calcium: 9.2 mg/dL (ref 8.4–10.5)
Chloride: 105 mEq/L (ref 96–112)
Creatinine, Ser: 0.47 mg/dL (ref 0.40–1.20)
GFR: 150.61 mL/min (ref >60.00)
Glucose, Bld: 86 mg/dL (ref 70–99)
POTASSIUM: 3.6 meq/L (ref 3.5–5.1)
SODIUM: 139 meq/L (ref 135–145)

## 2017-01-09 LAB — TSH: TSH: 0.44 u[IU]/mL (ref 0.35–4.50)

## 2017-01-10 ENCOUNTER — Other Ambulatory Visit: Payer: Self-pay | Admitting: Family Medicine

## 2017-01-10 DIAGNOSIS — D649 Anemia, unspecified: Secondary | ICD-10-CM

## 2017-01-22 ENCOUNTER — Ambulatory Visit (INDEPENDENT_AMBULATORY_CARE_PROVIDER_SITE_OTHER): Payer: BLUE CROSS/BLUE SHIELD | Admitting: Family Medicine

## 2017-01-22 VITALS — BP 110/68 | HR 82 | Temp 98.2°F | Ht 64.0 in | Wt 152.6 lb

## 2017-01-22 DIAGNOSIS — R5383 Other fatigue: Secondary | ICD-10-CM

## 2017-01-22 DIAGNOSIS — D649 Anemia, unspecified: Secondary | ICD-10-CM | POA: Diagnosis not present

## 2017-01-22 DIAGNOSIS — R42 Dizziness and giddiness: Secondary | ICD-10-CM

## 2017-01-22 LAB — IRON,TIBC AND FERRITIN PANEL
%SAT: 20 % (calc) (ref 11–50)
Ferritin: 14 ng/mL (ref 10–232)
Iron: 56 ug/dL (ref 40–190)
TIBC: 281 mcg/dL (calc) (ref 250–450)

## 2017-01-22 MED ORDER — MECLIZINE HCL 25 MG PO TABS: 25.0000 mg | ORAL_TABLET | Freq: Every day | ORAL | 0 refills | Status: DC

## 2017-01-22 NOTE — Progress Notes (Signed)
Kimberly Mccann is a 46 y.o. female here for an acute visit.  History of Present Illness:   Britt Bottom CMA acting as scribe for Dr. Earlene Plater.  HPI: Patient comes in today to discuss her lab results. She presented last week for fatigue and dizziness. She was diagnosed with vertigo and mild anemia. Today she presents for a continued workup for the anemia and to assess for treatment for the vertigo. The vertigo symptoms are worse in the morning when she turns her head. They only last for a few seconds. She does develop some nausea. She denies any falls. No other neurologic symptoms. She denies any bleeding issues. No history of anemia.  PMHx, SurgHx, SocialHx, Medications, and Allergies were reviewed in the Visit Navigator and updated as appropriate.  Current Medications:   .  acetaminophen (TYLENOL) 500 MG tablet, Take 500 mg by mouth every 6 (six) hours as needed., Disp: , Rfl:  .  cetirizine (ZYRTEC) 10 MG tablet, Take 1 tablet (10 mg total) by mouth daily., Disp: 30 tablet, Rfl: 1 .  fluticasone (FLONASE) 50 MCG/ACT nasal spray, Place 2 sprays into both nostrils daily., Disp: 16 g, Rfl: 6 .  Vitamin D, Ergocalciferol, (DRISDOL) 50000 units CAPS capsule, Take 1 capsule (50,000 Units total) by mouth every 7 (seven) days., Disp: 12 capsule, Rfl: 0   No Known Allergies   Review of Systems:   Pertinent items are noted in the HPI. Otherwise, ROS is negative.  Vitals:   Vitals:   01/22/17 1518  BP: 110/68  Pulse: 82  Temp: 98.2 F (36.8 C)  TempSrc: Oral  SpO2: 96%  Weight: 152 lb 9.6 oz (69.2 kg)  Height:  (1.626 m)     Body mass index is 26.19 kg/m.   Physical Exam:   Physical Exam  Constitutional: She appears well-nourished.  HENT:  Head: Normocephalic and atraumatic.  Eyes: Pupils are equal, round, and reactive to light. EOM are normal.  Neck: Normal range of motion. Neck supple.  Cardiovascular: Normal rate, regular rhythm, normal heart sounds and intact  distal pulses.   Pulmonary/Chest: Effort normal.  Abdominal: Soft.  Skin: Skin is warm.  Psychiatric: She has a normal mood and affect. Her behavior is normal.  Nursing note and vitals reviewed.  Results for orders placed or performed in visit on 01/22/17  Iron, TIBC and Ferritin Panel  Result Value Ref Range   Iron 56 40 - 190 mcg/dL   TIBC 409 811 - 914 mcg/dL (calc)   %SAT 20 11 - 50 % (calc)   Ferritin 14 10 - 232 ng/mL  Vitamin B12  Result Value Ref Range   Vitamin B-12 532 211 - 911 pg/mL  Folate  Result Value Ref Range   Folate 19.9 >5.9 ng/mL  VITAMIN D 25 Hydroxy (Vit-D Deficiency, Fractures)  Result Value Ref Range   Vit D, 25-Hydroxy 34 30 - 100 ng/mL   Assessment and Plan:   Kimberly Mccann was seen today for follow-up.  Diagnoses and all orders for this visit:  Anemia, unspecified type -     Iron, TIBC and Ferritin Panel -     Vitamin B12 -     Folate -     VITAMIN D 25 Hydroxy (Vit-D Deficiency, Fractures)  Vertigo Comments: Discussed diagnosis of BPPV and typical symptoms as well as pattern. Reassured patient that this should resolve and is not dangerous. Okay to use OTC meclizine if needed. Cautioned about sedation.  Discussed Epley's maneuvers. Encouraged patient to view  them on You Tube and try them at home to see if she can get some relief.  Encouraged to call or follow up if issue persists. Would refer to ENT for further evaluation and management. Orders: -     meclizine (ANTIVERT) 25 MG tablet; Take 1 tablet (25 mg total) by mouth at bedtime.  . Reviewed expectations re: course of current medical issues. . Discussed self-management of symptoms. . Outlined signs and symptoms indicating need for more acute intervention. . Patient verbalized understanding and all questions were answered. Marland Kitchen Health Maintenance issues including appropriate healthy diet, exercise, and smoking avoidance were discussed with patient. . See orders for this visit as documented in the  electronic medical record. . Patient received an After Visit Summary.  CMA served as Neurosurgeon during this visit. History, Physical, and Plan performed by medical provider. The above documentation has been reviewed and is accurate and complete. Helane Rima, D.O.  Helane Rima, DO [place] 01/27/2017

## 2017-01-23 LAB — VITAMIN D 25 HYDROXY (VIT D DEFICIENCY, FRACTURES): Vit D, 25-Hydroxy: 34 ng/mL (ref 30–100)

## 2017-01-23 LAB — FOLATE: Folate: 19.9 ng/mL (ref >5.9)

## 2017-01-23 LAB — VITAMIN B12: Vitamin B-12: 532 pg/mL (ref 211–911)

## 2017-01-27 ENCOUNTER — Encounter: Payer: Self-pay | Admitting: Family Medicine

## 2017-08-02 ENCOUNTER — Encounter: Payer: Self-pay | Admitting: Family Medicine

## 2017-08-02 ENCOUNTER — Other Ambulatory Visit: Payer: Self-pay

## 2017-08-02 ENCOUNTER — Ambulatory Visit: Payer: BLUE CROSS/BLUE SHIELD | Admitting: Family Medicine

## 2017-08-02 VITALS — BP 108/68 | HR 80 | Temp 98.5°F | Resp 12 | Ht 63.23 in | Wt 158.0 lb

## 2017-08-02 DIAGNOSIS — R197 Diarrhea, unspecified: Secondary | ICD-10-CM | POA: Diagnosis not present

## 2017-08-02 DIAGNOSIS — K529 Noninfective gastroenteritis and colitis, unspecified: Secondary | ICD-10-CM

## 2017-08-02 DIAGNOSIS — R112 Nausea with vomiting, unspecified: Secondary | ICD-10-CM | POA: Diagnosis not present

## 2017-08-02 DIAGNOSIS — R1084 Generalized abdominal pain: Secondary | ICD-10-CM | POA: Diagnosis not present

## 2017-08-02 MED ORDER — ONDANSETRON 4 MG PO TBDP: 4.0000 mg | ORAL_TABLET | Freq: Three times a day (TID) | ORAL | 0 refills | Status: DC | PRN

## 2017-08-02 NOTE — Patient Instructions (Addendum)
Zofran if needed for nausea or vomiting, make sure to drink small sips of fluids frequently, avoid solid foods until your symptoms have significantly improved, then bland foods in small quantities initially.  I will check for test for infectious causes of diarrhea, but I expect your symptoms to continue to improve.  Return to the clinic or go to the nearest emergency room if any of your symptoms worsen or new symptoms occur.   Gastroenteritis:  Diarrhea Infections caused by germs (bacterial) or a virus commonly cause diarrhea. Your caregiver has determined that with time, rest and fluids, the diarrhea should improve. In general, eat normally while drinking more water than usual. Although water may prevent dehydration, it does not contain salt and minerals (electrolytes). Broths, weak tea without caffeine and oral rehydration solutions (ORS) replace fluids and electrolytes. Small amounts of fluids should be taken frequently. Large amounts at one time may not be tolerated. Plain water may be harmful in infants and the elderly. Oral rehydrating solutions (ORS) are available at pharmacies and grocery stores. ORS replace water and important electrolytes in proper proportions. Sports drinks are not as effective as ORS and may be harmful due to sugars worsening diarrhea.  ORS is especially recommended for use in children with diarrhea. As a general guideline for children, replace any new fluid losses from diarrhea and/or vomiting with ORS as follows:   If your child weighs 22 pounds or under (10 kg or less), give 60-120 mL ( -  cup or 2 - 4 ounces) of ORS for each episode of diarrheal stool or vomiting episode.   If your child weighs more than 22 pounds (more than 10 kgs), give 120-240 mL ( - 1 cup or 4 - 8 ounces) of ORS for each diarrheal stool or episode of vomiting.   While correcting for dehydration, children should eat normally. However, foods high in sugar should be avoided because this may  worsen diarrhea. Large amounts of carbonated soft drinks, juice, gelatin desserts and other highly sugared drinks should be avoided.   After correction of dehydration, other liquids that are appealing to the child may be added. Children should drink small amounts of fluids frequently and fluids should be increased as tolerated. Children should drink enough fluids to keep urine clear or pale yellow.   Adults should eat normally while drinking more fluids than usual. Drink small amounts of fluids frequently and increase as tolerated. Drink enough fluids to keep urine clear or pale yellow. Broths, weak decaffeinated tea, lemon lime soft drinks (allowed to go flat) and ORS replace fluids and electrolytes.   Avoid:   Carbonated drinks.   Juice.   Extremely hot or cold fluids.   Caffeine drinks.   Fatty, greasy foods.   Alcohol.   Tobacco.   Too much intake of anything at one time.   Gelatin desserts.   Probiotics are active cultures of beneficial bacteria. They may lessen the amount and number of diarrheal stools in adults. Probiotics can be found in yogurt with active cultures and in supplements.   Wash hands well to avoid spreading bacteria and virus.   Anti-diarrheal medications are not recommended for infants and children.   Only take over-the-counter or prescription medicines for pain, discomfort or fever as directed by your caregiver. Do not give aspirin to children because it may cause Reye's Syndrome.   For adults, ask your caregiver if you should continue all prescribed and over-the-counter medicines.   If your caregiver has given you a follow-up  appointment, it is very important to keep that appointment. Not keeping the appointment could result in a chronic or permanent injury, and disability. If there is any problem keeping the appointment, you must call back to this facility for assistance.  SEEK IMMEDIATE MEDICAL CARE IF:   You or your child is unable to keep fluids  down or other symptoms or problems become worse in spite of treatment.   Vomiting or diarrhea develops and becomes persistent.   There is vomiting of blood or bile (green material).   There is blood in the stool or the stools are black and tarry.   There is no urine output in 6-8 hours or there is only a small amount of very dark urine.   Abdominal pain develops, increases or localizes.   You have a fever.   Your baby is older than 3 months with a rectal temperature of 102 F (38.9 C) or higher.   Your baby is 59 months old or younger with a rectal temperature of 100.4 F (38 C) or higher.   You or your child develops excessive weakness, dizziness, fainting or extreme thirst.   You or your child develops a rash, stiff neck, severe headache or become irritable or sleepy and difficult to awaken.  MAKE SURE YOU:   Understand these instructions.   Will watch your condition.   Will get help right away if you are not doing well or get worse.  Document Released: 03/23/2002 Document Revised: 03/22/2011 Document Reviewed: 02/07/2009 [facility] Patient Information 2012 [place], [place].  Nausea and Vomiting Nausea is a sick feeling that often comes before throwing up (vomiting). Vomiting is a reflex where stomach contents come out of your mouth. Vomiting can cause severe loss of body fluids (dehydration). Children and elderly adults can become dehydrated quickly, especially if they also have diarrhea. Nausea and vomiting are symptoms of a condition or disease. It is important to find the cause of your symptoms. CAUSES   Direct irritation of the stomach lining. This irritation can result from increased acid production (gastroesophageal reflux disease), infection, food poisoning, taking certain medicines (such as nonsteroidal anti-inflammatory drugs), alcohol use, or tobacco use.   Signals from the brain.These signals could be caused by a headache, heat exposure, an inner ear disturbance,  increased pressure in the brain from injury, infection, a tumor, or a concussion, pain, emotional stimulus, or metabolic problems.   An obstruction in the gastrointestinal tract (bowel obstruction).   Illnesses such as diabetes, hepatitis, gallbladder problems, appendicitis, kidney problems, cancer, sepsis, atypical symptoms of a heart attack, or eating disorders.   Medical treatments such as chemotherapy and radiation.   Receiving medicine that makes you sleep (general anesthetic) during surgery.  DIAGNOSIS Your caregiver may ask for tests to be done if the problems do not improve after a few days. Tests may also be done if symptoms are severe or if the reason for the nausea and vomiting is not clear. Tests may include:  Urine tests.   Blood tests.   Stool tests.   Cultures (to look for evidence of infection).   X-rays or other imaging studies.  Test results can help your caregiver make decisions about treatment or the need for additional tests. TREATMENT You need to stay well hydrated. Drink frequently but in small amounts.You may wish to drink water, sports drinks, clear broth, or eat frozen ice pops or gelatin dessert to help stay hydrated.When you eat, eating slowly may help prevent nausea.There are also some antinausea medicines  that may help prevent nausea. HOME CARE INSTRUCTIONS   Take all medicine as directed by your caregiver.   If you do not have an appetite, do not force yourself to eat. However, you must continue to drink fluids.   If you have an appetite, eat a normal diet unless your caregiver tells you differently.   Eat a variety of complex carbohydrates (rice, wheat, potatoes, bread), lean meats, yogurt, fruits, and vegetables.   Avoid high-fat foods because they are more difficult to digest.   Drink enough water and fluids to keep your urine clear or pale yellow.   If you are dehydrated, ask your caregiver for specific rehydration instructions. Signs of  dehydration may include:   Severe thirst.   Dry lips and mouth.   Dizziness.   Dark urine.   Decreasing urine frequency and amount.   Confusion.   Rapid breathing or pulse.  SEEK IMMEDIATE MEDICAL CARE IF:   You have blood or brown flecks (like coffee grounds) in your vomit.   You have black or bloody stools.   You have a severe headache or stiff neck.   You are confused.   You have severe abdominal pain.   You have chest pain or trouble breathing.   You do not urinate at least once every 8 hours.   You develop cold or clammy skin.   You continue to vomit for longer than 24 to 48 hours.   You have a fever.  MAKE SURE YOU:   Understand these instructions.   Will watch your condition.   Will get help right away if you are not doing well or get worse.  Document Released: 04/02/2005 Document Revised: 03/22/2011 Document Reviewed: 08/30/2010 [facility] Patient Information 2012 [place], [place].  Return to the clinic or go to the nearest emergency room if any of your symptoms worsen or new symptoms occur.     IF you received an x-ray today, you will receive an invoice from [facility]. Please contact [facility] at [phone] with questions or concerns regarding your invoice.   IF you received labwork today, you will receive an invoice from [facility]. Please contact [facility] at [phone] with questions or concerns regarding your invoice.   Our billing staff will not be able to assist you with questions regarding bills from these companies.  You will be contacted with the lab results as soon as they are available. The fastest way to get your results is to activate your My Chart account. Instructions are located on the last page of this paperwork. If you have not heard from [place] regarding the results in 2 weeks, please contact this office.

## 2017-08-02 NOTE — Progress Notes (Signed)
Subjective:  By signing my name below, I, Stann Ore, attest that this documentation has been prepared under the direction and in the presence of Meredith Staggers, MD. Electronically Signed: Stann Ore, Scribe. 08/02/2017 , 2:58 PM .  Patient was seen in Room 12 .   Patient ID: Kimberly Mccann, female    DOB: 31-May-1970, 47 y.o.   MRN: [id number] Chief Complaint  Patient presents with  . Abdominal Pain    onset x3 days ago in the middle of the night after dinner. Pain is intermittent. Abdomen gets "sweaty and hard" and painful if she eats anything else. Nausea and vomiting, no vomiting today. Diarrhea stools. Seeing red in toilet bowl- not sure if blood.    HPI Kimberly Mccann is a 47 y.o. female  Here with abdominal pain, 3 day history, with nausea, vomiting and diarrhea. Patient states her symptoms started with abdominal pain 3 days ago, with vomiting in the middle of the night. She mentions feeling a little itch that caused her to vomit last night around 9:00PM; although, she noticed a little blood in the vomit. This was her last vomiting. Yesterday, she vomited twice. She also notes having diarrhea that started 3 days ago as well, about 4~5 times a day, yesterday 4~5 times a day as well. Today, she's only had diarrhea about 3 times, no more vomiting today. She still feels nauseous with some headache. She's been drinking Gatorade, and eating small crackers, hasn't been able to eat solid foods. She informs eating, her abdomen becomes tight. She denies any sick contact at home. She denies recent foreign travel. She denies recent antibiotic use, or hospitalization. She tried Pepto without relief. She's had a few hot flashes, but no measured fever. She denies alcohol use. She's urinated about 4~5 times today, as she's keeping down fluids. She's had history of cholecystectomy done about 9 years ago.   Patient Active Problem List   Diagnosis Date Noted  . OAB (overactive bladder) 09/24/2014  .  IUD (intrauterine device) in place 08/18/2014  . History of hyperprolactinemia 11/11/2013  . Hair loss 11/11/2013  . History of cervical dysplasia 11/11/2013   Past Medical History:  Diagnosis Date  . Allergy    Seasonal allergies  . Anemia   . Dysplasia of cervix   . Hyperprolactinemia (HCC)   . Overactive bladder   . Vitamin D deficiency 05/2016   Past Surgical History:  Procedure Laterality Date  . CHOLECYSTECTOMY     9 years ago   No Known Allergies Prior to Admission medications   Medication Sig Start Date End Date Taking? Authorizing Provider  acetaminophen (TYLENOL) 500 MG tablet Take 500 mg by mouth every 6 (six) hours as needed.    [provider]  cetirizine (ZYRTEC) 10 MG tablet Take 1 tablet (10 mg total) by mouth daily. 09/06/16   Helane Rima, DO  fluticasone (FLONASE) 50 MCG/ACT nasal spray Place 2 sprays into both nostrils daily. 09/06/16   Helane Rima, DO  meclizine (ANTIVERT) 25 MG tablet Take 1 tablet (25 mg total) by mouth at bedtime. 01/22/17   Helane Rima, DO  Vitamin D, Ergocalciferol, (DRISDOL) 50000 units CAPS capsule Take 1 capsule (50,000 Units total) by mouth every 7 (seven) days. 11/12/16   Ok Edwards, MD   Social History   Socioeconomic History  . Marital status: Married    Spouse name: Not on file  . Number of children: Not on file  . Years of education: Not on file  .  Highest education level: Not on file  Occupational History  . Not on file  Social Needs  . Financial resource strain: Not on file  . Food insecurity:    Worry: Not on file    Inability: Not on file  . Transportation needs:    Medical: Not on file    Non-medical: Not on file  Tobacco Use  . Smoking status: Never Smoker  . Smokeless tobacco: Never Used  Substance and Sexual Activity  . Alcohol use: No    Alcohol/week: 0.0 oz  . Drug use: No  . Sexual activity: Yes    Birth control/protection: IUD    Comment: 1st intercourse 16 yo-5  partners-[employer] inserted 08-2014  Lifestyle  . Physical activity:    Days per week: Not on file    Minutes per session: Not on file  . Stress: Not on file  Relationships  . Social connections:    Talks on phone: Not on file    Gets together: Not on file    Attends religious service: Not on file    Active member of club or organization: Not on file    Attends meetings of clubs or organizations: Not on file    Relationship status: Not on file  . Intimate partner violence:    Fear of current or ex partner: Not on file    Emotionally abused: Not on file    Physically abused: Not on file    Forced sexual activity: Not on file  Other Topics Concern  . Not on file  Social History Narrative  . Not on file   Review of Systems  Constitutional: Positive for diaphoresis. Negative for fatigue, fever and unexpected weight change.  Respiratory: Negative for cough.   Gastrointestinal: Positive for abdominal pain, diarrhea, nausea and vomiting. Negative for blood in stool and constipation.  Skin: Negative for rash and wound.  Neurological: Positive for headaches. Negative for dizziness and weakness.       Objective:   Physical Exam  Constitutional: She is oriented to person, place, and time. She appears well-developed and well-nourished. No distress.  HENT:  Head: Normocephalic and atraumatic.  Mouth/Throat: Mucous membranes are normal.  Eyes: Pupils are equal, round, and reactive to light. EOM are normal.  Neck: Neck supple.  Cardiovascular: Normal rate, regular rhythm and normal heart sounds. Exam reveals no gallop and no friction rub.  No murmur heard. Pulmonary/Chest: Effort normal and breath sounds normal. No respiratory distress. She has no wheezes.  Abdominal: There is tenderness (slight discomfort) in the epigastric area. There is no CVA tenderness, no tenderness at McBurney's point and negative Murphy's sign.  Musculoskeletal: Normal range of motion.  Neurological: She is alert  and oriented to person, place, and time.  Skin: Skin is warm and dry.  Psychiatric: She has a normal mood and affect. Her behavior is normal.  Nursing note and vitals reviewed.   Vitals:   08/02/17 1425  BP: 108/68  Pulse: 80  Resp: 12  Temp: 98.5 F (36.9 C)  SpO2: 100%  Weight: 158 lb (71.7 kg)  Height: 5' 3.23" (1.606 m)       Assessment & Plan:   Kimberly Mccann is a 47 y.o. female Nausea and vomiting, intractability of vomiting not specified, unspecified vomiting type - Plan: ondansetron (ZOFRAN ODT) 4 MG disintegrating tablet  Diarrhea, unspecified type - Plan: ondansetron (ZOFRAN ODT) 4 MG disintegrating tablet, GI Profile, Stool, PCR  Noninfectious gastroenteritis, unspecified type - Plan: ondansetron (ZOFRAN ODT) 4 MG  disintegrating tablet  Generalized abdominal pain  Suspected viral gastroenteritis versus foodborne illness.  Reassuring vital signs, appears well-hydrated and is tolerating p.o. fluids.  Vomiting has decreased with last episode last night, still some slight nausea, and still with multiple episodes of diarrhea today.  -Start Zofran as needed for nausea, small sips of fluids frequently and slow resumption of solid foods discussed  -Check stool testing for infectious cause, but discussed most likely symptomatic care depending on organism.  Does not appear to have risk factors at present for C. difficile colitis.  -RTC precautions given, handout given.  Meds ordered this encounter  Medications  . ondansetron (ZOFRAN ODT) 4 MG disintegrating tablet    Sig: Take 1 tablet (4 mg total) by mouth every 8 (eight) hours as needed for nausea or vomiting.    Dispense:  10 tablet    Refill:  0   Patient Instructions   Zofran if needed for nausea or vomiting, make sure to drink small sips of fluids frequently, avoid solid foods until your symptoms have significantly improved, then bland foods in small quantities initially.  I will check for test for infectious  causes of diarrhea, but I expect your symptoms to continue to improve.  Return to the clinic or go to the nearest emergency room if any of your symptoms worsen or new symptoms occur.   Gastroenteritis:  Diarrhea Infections caused by germs (bacterial) or a virus commonly cause diarrhea. Your caregiver has determined that with time, rest and fluids, the diarrhea should improve. In general, eat normally while drinking more water than usual. Although water may prevent dehydration, it does not contain salt and minerals (electrolytes). Broths, weak tea without caffeine and oral rehydration solutions (ORS) replace fluids and electrolytes. Small amounts of fluids should be taken frequently. Large amounts at one time may not be tolerated. Plain water may be harmful in infants and the elderly. Oral rehydrating solutions (ORS) are available at pharmacies and grocery stores. ORS replace water and important electrolytes in proper proportions. Sports drinks are not as effective as ORS and may be harmful due to sugars worsening diarrhea.  ORS is especially recommended for use in children with diarrhea. As a general guideline for children, replace any new fluid losses from diarrhea and/or vomiting with ORS as follows:   If your child weighs 22 pounds or under (10 kg or less), give 60-120 mL ( -  cup or 2 - 4 ounces) of ORS for each episode of diarrheal stool or vomiting episode.   If your child weighs more than 22 pounds (more than 10 kgs), give 120-240 mL ( - 1 cup or 4 - 8 ounces) of ORS for each diarrheal stool or episode of vomiting.   While correcting for dehydration, children should eat normally. However, foods high in sugar should be avoided because this may worsen diarrhea. Large amounts of carbonated soft drinks, juice, gelatin desserts and other highly sugared drinks should be avoided.   After correction of dehydration, other liquids that are appealing to the child may be added. Children should drink  small amounts of fluids frequently and fluids should be increased as tolerated. Children should drink enough fluids to keep urine clear or pale yellow.   Adults should eat normally while drinking more fluids than usual. Drink small amounts of fluids frequently and increase as tolerated. Drink enough fluids to keep urine clear or pale yellow. Broths, weak decaffeinated tea, lemon lime soft drinks (allowed to go flat) and ORS replace fluids and  electrolytes.   Avoid:   Carbonated drinks.   Juice.   Extremely hot or cold fluids.   Caffeine drinks.   Fatty, greasy foods.   Alcohol.   Tobacco.   Too much intake of anything at one time.   Gelatin desserts.   Probiotics are active cultures of beneficial bacteria. They may lessen the amount and number of diarrheal stools in adults. Probiotics can be found in yogurt with active cultures and in supplements.   Wash hands well to avoid spreading bacteria and virus.   Anti-diarrheal medications are not recommended for infants and children.   Only take over-the-counter or prescription medicines for pain, discomfort or fever as directed by your caregiver. Do not give aspirin to children because it may cause Reye's Syndrome.   For adults, ask your caregiver if you should continue all prescribed and over-the-counter medicines.   If your caregiver has given you a follow-up appointment, it is very important to keep that appointment. Not keeping the appointment could result in a chronic or permanent injury, and disability. If there is any problem keeping the appointment, you must call back to this facility for assistance.  SEEK IMMEDIATE MEDICAL CARE IF:   You or your child is unable to keep fluids down or other symptoms or problems become worse in spite of treatment.   Vomiting or diarrhea develops and becomes persistent.   There is vomiting of blood or bile (green material).   There is blood in the stool or the stools are black and tarry.     There is no urine output in 6-8 hours or there is only a small amount of very dark urine.   Abdominal pain develops, increases or localizes.   You have a fever.   Your baby is older than 3 months with a rectal temperature of 102 F (38.9 C) or higher.   Your baby is 49 months old or younger with a rectal temperature of 100.4 F (38 C) or higher.   You or your child develops excessive weakness, dizziness, fainting or extreme thirst.   You or your child develops a rash, stiff neck, severe headache or become irritable or sleepy and difficult to awaken.  MAKE SURE YOU:   Understand these instructions.   Will watch your condition.   Will get help right away if you are not doing well or get worse.  Document Released: 03/23/2002 Document Revised: 03/22/2011 Document Reviewed: 02/07/2009 [facility] Patient Information 2012 [place], [place].  Nausea and Vomiting Nausea is a sick feeling that often comes before throwing up (vomiting). Vomiting is a reflex where stomach contents come out of your mouth. Vomiting can cause severe loss of body fluids (dehydration). Children and elderly adults can become dehydrated quickly, especially if they also have diarrhea. Nausea and vomiting are symptoms of a condition or disease. It is important to find the cause of your symptoms. CAUSES   Direct irritation of the stomach lining. This irritation can result from increased acid production (gastroesophageal reflux disease), infection, food poisoning, taking certain medicines (such as nonsteroidal anti-inflammatory drugs), alcohol use, or tobacco use.   Signals from the brain.These signals could be caused by a headache, heat exposure, an inner ear disturbance, increased pressure in the brain from injury, infection, a tumor, or a concussion, pain, emotional stimulus, or metabolic problems.   An obstruction in the gastrointestinal tract (bowel obstruction).   Illnesses such as diabetes, hepatitis, gallbladder  problems, appendicitis, kidney problems, cancer, sepsis, atypical symptoms of a heart attack, or  eating disorders.   Medical treatments such as chemotherapy and radiation.   Receiving medicine that makes you sleep (general anesthetic) during surgery.  DIAGNOSIS Your caregiver may ask for tests to be done if the problems do not improve after a few days. Tests may also be done if symptoms are severe or if the reason for the nausea and vomiting is not clear. Tests may include:  Urine tests.   Blood tests.   Stool tests.   Cultures (to look for evidence of infection).   X-rays or other imaging studies.  Test results can help your caregiver make decisions about treatment or the need for additional tests. TREATMENT You need to stay well hydrated. Drink frequently but in small amounts.You may wish to drink water, sports drinks, clear broth, or eat frozen ice pops or gelatin dessert to help stay hydrated.When you eat, eating slowly may help prevent nausea.There are also some antinausea medicines that may help prevent nausea. HOME CARE INSTRUCTIONS   Take all medicine as directed by your caregiver.   If you do not have an appetite, do not force yourself to eat. However, you must continue to drink fluids.   If you have an appetite, eat a normal diet unless your caregiver tells you differently.   Eat a variety of complex carbohydrates (rice, wheat, potatoes, bread), lean meats, yogurt, fruits, and vegetables.   Avoid high-fat foods because they are more difficult to digest.   Drink enough water and fluids to keep your urine clear or pale yellow.   If you are dehydrated, ask your caregiver for specific rehydration instructions. Signs of dehydration may include:   Severe thirst.   Dry lips and mouth.   Dizziness.   Dark urine.   Decreasing urine frequency and amount.   Confusion.   Rapid breathing or pulse.  SEEK IMMEDIATE MEDICAL CARE IF:   You have blood or brown flecks  (like coffee grounds) in your vomit.   You have black or bloody stools.   You have a severe headache or stiff neck.   You are confused.   You have severe abdominal pain.   You have chest pain or trouble breathing.   You do not urinate at least once every 8 hours.   You develop cold or clammy skin.   You continue to vomit for longer than 24 to 48 hours.   You have a fever.  MAKE SURE YOU:   Understand these instructions.   Will watch your condition.   Will get help right away if you are not doing well or get worse.  Document Released: 04/02/2005 Document Revised: 03/22/2011 Document Reviewed: 08/30/2010 [facility] Patient Information 2012 [place]  Return to the clinic or go to the nearest emergency room if any of your symptoms worsen or new symptoms occur.     IF you received an x-ray today, you will receive an invoice from [facility]. Please contact [facility] at [phone] with questions or concerns regarding your invoice.   IF you received labwork today, you will receive an invoice from [facility]. Please contact [facility] at [phone] with questions or concerns regarding your invoice.   Our billing staff will not be able to assist you with questions regarding bills from these companies.  You will be contacted with the lab results as soon as they are available. The fastest way to get your results is to activate your My Chart account. Instructions are located on the last page of this paperwork. If you have not heard  from [place] regarding the results in 2 weeks, please contact this office.      I personally performed the services described in this documentation, which was scribed in my presence. The recorded information has been reviewed and considered for accuracy and completeness, addended by me as needed, and agree with information above.  Signed,   Meredith Staggers, MD [facility] at [facility].  08/02/17 3:27  PM

## 2017-08-05 LAB — GI PROFILE, STOOL, PCR
ADENOVIRUS F 40/41: NOT DETECTED
Astrovirus: NOT DETECTED
C DIFFICILE TOXIN A/B: NOT DETECTED
CAMPYLOBACTER: NOT DETECTED
Cryptosporidium: NOT DETECTED
Cyclospora cayetanensis: NOT DETECTED
ENTEROAGGREGATIVE E COLI: NOT DETECTED
ENTEROTOXIGENIC E COLI: NOT DETECTED
Entamoeba histolytica: NOT DETECTED
Enteropathogenic E coli: NOT DETECTED
GIARDIA LAMBLIA: NOT DETECTED
NOROVIRUS GI/GII: DETECTED — AB
Plesiomonas shigelloides: NOT DETECTED
Rotavirus A: NOT DETECTED
SHIGELLA/ENTEROINVASIVE E COLI: NOT DETECTED
Salmonella: NOT DETECTED
Sapovirus: NOT DETECTED
Shiga-toxin-producing E coli: NOT DETECTED
Vibrio cholerae: NOT DETECTED
Vibrio: NOT DETECTED
YERSINIA ENTEROCOLITICA: NOT DETECTED

## 2017-08-21 ENCOUNTER — Encounter: Payer: Self-pay | Admitting: Emergency Medicine

## 2017-08-21 ENCOUNTER — Ambulatory Visit: Payer: BLUE CROSS/BLUE SHIELD | Admitting: Emergency Medicine

## 2017-08-21 VITALS — BP 128/83 | HR 80 | Temp 98.2°F | Resp 17 | Ht 63.0 in | Wt 162.0 lb

## 2017-08-21 DIAGNOSIS — R1013 Epigastric pain: Secondary | ICD-10-CM | POA: Diagnosis not present

## 2017-08-21 DIAGNOSIS — Z8619 Personal history of other infectious and parasitic diseases: Secondary | ICD-10-CM | POA: Diagnosis not present

## 2017-08-21 NOTE — Progress Notes (Signed)
Kimberly Mccann 47 y.o.   Chief Complaint  Patient presents with  . Follow-up    stomach pain     HISTORY OF PRESENT ILLNESS: This is a 47 y.o. female complaining of upper abdominal discomfort on and off for the past several weeks.  Some nausea but no vomiting.  Denies diarrhea, rectal bleeding, fever or chills.  Was seen here on 08/02/2017 with nausea and vomiting.  Stool profile test came back and negative for all bacteria, negative for amoeba, negative for Giardia.  Only positive for norovirus.  Patient recovering well.  Significant past medical history is she tested positive for H. pylori 5 years ago.  No other significant symptoms.  No other medical concerns or complaints.  No change of diet or lifestyle. Onset several weeks ago: Location: Epigastric. Duration: Minutes Character: Dull with rumbling Aggravating factors: Certain foods Relieving factors: Certain foods Therapies tried: Nothing Severity: Waxes and wanes  HPI   Prior to Admission medications   Medication Sig Start Date End Date Taking? Authorizing Provider  Vitamin D, Ergocalciferol, (DRISDOL) 50000 units CAPS capsule Take 1 capsule (50,000 Units total) by mouth every 7 (seven) days. 11/12/16  Yes Ok Edwards, MD  acetaminophen (TYLENOL) 500 MG tablet Take 500 mg by mouth every 6 (six) hours as needed.    [provider]  cetirizine (ZYRTEC) 10 MG tablet Take 1 tablet (10 mg total) by mouth daily. Patient not taking: Reported on 08/21/2017 09/06/16   Helane Rima, DO  fluticasone [facility]) 50 MCG/ACT nasal spray Place 2 sprays into both nostrils daily. Patient not taking: Reported on 08/21/2017 09/06/16   Helane Rima, DO  ondansetron (ZOFRAN ODT) 4 MG disintegrating tablet Take 1 tablet (4 mg total) by mouth every 8 (eight) hours as needed for nausea or vomiting. Patient not taking: Reported on 08/21/2017 08/02/17   Shade Flood, MD  UNKNOWN TO PATIENT     [provider]    No Known  Allergies  Patient Active Problem List   Diagnosis Date Noted  . OAB (overactive bladder) 09/24/2014  . IUD (intrauterine device) in place 08/18/2014  . History of hyperprolactinemia 11/11/2013  . Hair loss 11/11/2013  . History of cervical dysplasia 11/11/2013    Past Medical History:  Diagnosis Date  . Allergy    Seasonal allergies  . Anemia   . Dysplasia of cervix   . Hyperprolactinemia (HCC)   . Overactive bladder   . Vitamin D deficiency 05/2016    Past Surgical History:  Procedure Laterality Date  . CHOLECYSTECTOMY     9 years ago    Social History   Socioeconomic History  . Marital status: Married    Spouse name: Not on file  . Number of children: Not on file  . Years of education: Not on file  . Highest education level: Not on file  Occupational History  . Not on file  Social Needs  . Financial resource strain: Not on file  . Food insecurity:    Worry: Not on file    Inability: Not on file  . Transportation needs:    Medical: Not on file    Non-medical: Not on file  Tobacco Use  . Smoking status: Never Smoker  . Smokeless tobacco: Never Used  Substance and Sexual Activity  . Alcohol use: No    Alcohol/week: 0.0 oz  . Drug use: No  . Sexual activity: Yes    Birth control/protection: IUD    Comment: 1st intercourse 16 yo-5 partners-[place]  inserted 08-2014  Lifestyle  . Physical activity:    Days per week: Not on file    Minutes per session: Not on file  . Stress: Not on file  Relationships  . Social connections:    Talks on phone: Not on file    Gets together: Not on file    Attends religious service: Not on file    Active member of club or organization: Not on file    Attends meetings of clubs or organizations: Not on file    Relationship status: Not on file  . Intimate partner violence:    Fear of current or ex partner: Not on file    Emotionally abused: Not on file    Physically abused: Not on file    Forced sexual activity: Not on  file  Other Topics Concern  . Not on file  Social History Narrative  . Not on file    Family History  Problem Relation Age of Onset  . Hypertension Mother   . Multiple sclerosis Brother   . Multiple sclerosis Sister      Review of Systems  Constitutional: Negative.  Negative for chills and fever.  HENT: Negative.  Negative for sore throat.   Eyes: Negative.  Negative for discharge.  Respiratory: Negative.  Negative for cough and shortness of breath.   Cardiovascular: Negative.  Negative for chest pain and palpitations.  Gastrointestinal: Positive for abdominal pain and nausea. Negative for blood in stool, diarrhea and melena.  Genitourinary: Negative.  Negative for dysuria and hematuria.  Musculoskeletal: Negative.  Negative for back pain, myalgias and neck pain.  Skin: Negative.  Negative for rash.  Neurological: Negative.  Negative for dizziness and headaches.  Endo/Heme/Allergies: Negative.   All other systems reviewed and are negative.   Vitals:   08/21/17 1429  BP: 128/83  Pulse: 80  Resp: 17  Temp: 98.2 F (36.8 C)  SpO2: 98%    Physical Exam  Constitutional: She is oriented to person, place, and time. She appears well-developed and well-nourished.  HENT:  Head: Normocephalic and atraumatic.  Nose: Nose normal.  Mouth/Throat: Oropharynx is clear and moist.  Eyes: Pupils are equal, round, and reactive to light. Conjunctivae and EOM are normal.  Neck: Normal range of motion. Neck supple. No thyromegaly present.  Cardiovascular: Normal rate, regular rhythm and normal heart sounds.  Pulmonary/Chest: Effort normal and breath sounds normal.  Abdominal: Soft. Bowel sounds are normal. She exhibits no mass. There is tenderness (Mild epigastric discomfort). There is no rebound.  Musculoskeletal: Normal range of motion. She exhibits no edema.  Lymphadenopathy:    She has no cervical adenopathy.  Neurological: She is alert and oriented to person, place, and time. No  sensory deficit. She exhibits normal muscle tone.  Skin: Skin is warm and dry. Capillary refill takes less than 2 seconds.  Psychiatric: She has a normal mood and affect. Her behavior is normal.  Vitals reviewed.    ASSESSMENT & PLAN: Kimberly Mccann was seen today for follow-up.  Diagnoses and all orders for this visit:  Epigastric pain -     CBC with Differential/Platelet -     Comprehensive metabolic panel -     H. pylori breath test  History of Helicobacter pylori infection    Patient Instructions       IF you received an x-ray today, you will receive an invoice from [facility]. Please contact [facility] at [phone] with questions or concerns regarding your invoice.   IF you received  labwork today, you will receive an invoice from [employer]. Please contact [facility] at [phone] with questions or concerns regarding your invoice.   Our billing staff will not be able to assist you with questions regarding bills from these companies.  You will be contacted with the lab results as soon as they are available. The fastest way to get your results is to activate your My Chart account. Instructions are located on the last page of this paperwork. If you have not heard from [place] regarding the results in 2 weeks, please contact this office.      Dolor abdominal en los adultos Abdominal Pain, Adult El dolor de [place] (abdominal) puede tener muchas causas. La mayora de las veces, el dolor de [place] no es peligroso. Muchos de [employer] de dolor de estmago pueden controlarse y tratarse en casa. Sin embargo, a Occupational psychologist, Chief Technology Officer de [employer] puede ser grave. El mdico intentar descubrir la causa del dolor de [place]. Siga estas indicaciones en su casa:  Tome los medicamentos de venta libre y los recetados solamente como se lo haya indicado el mdico. No tome medicamentos que lo ayuden a Advertising copywriter (laxantes), salvo que el mdico se lo indique.  Beba suficiente  lquido para mantener el pis (orina) claro o de color amarillo plido.  Est atento al dolor de estmago para Insurance risk surveyor cambio.  Concurra a todas las visitas de control como se lo haya indicado el mdico. Esto es importante. Comunquese con un mdico si:  El dolor de [address] cambia o [place].  No tiene apetito o baja de peso sin proponrselo.  Tiene dificultades para defecar (est estreido) o heces lquidas (diarrea) durante ms de 2 o 3das.  Siente dolor al orinar o defecar.  El dolor de estmago lo despierta de noche.  El dolor empeora con las comidas, despus de comer o con determinados alimentos.  Vomita y no puede retener nada de lo que ingiere.  Tiene fiebre. Solicite ayuda de inmediato si:  El dolor no desaparece en el tiempo indicado por el mdico.  No puede detener los vmitos.  Siente dolor solamente en zonas especficas del abdomen, como el lado derecho o la parte inferior izquierda.  Tiene heces con sangre, de color negro o con aspecto alquitranado.  Tiene dolor muy intenso en el vientre, clicos o meteorismo.  Presenta signos de no tener suficientes lquidos o agua en el cuerpo (deshidratacin), por ejemplo: ? [place] es [place], es muy escasa o no orina. ? Labios agrietados. ? [employer]. ? Ojos hundidos. ? Somnolencia. ? Debilidad. Esta informacin no tiene Theme park manager el consejo del mdico. Asegrese de hacerle al mdico cualquier pregunta que tenga. Document Released: 06/29/2008 Document Revised: 06/27/2016 Document Reviewed: 09/14/2015 Elsevier Interactive Patient Education  2018 Elsevier Inc.  Abdominal Pain, Adult Many things can cause belly (abdominal) pain. Most times, belly pain is not dangerous. Many cases of belly pain can be watched and treated at home. Sometimes belly pain is serious, though. Your doctor will try to find the cause of your belly pain. Follow these instructions at home:  Take over-the-counter and  prescription medicines only as told by your doctor. Do not take medicines that help you poop (laxatives) unless told to by your doctor.  Drink enough fluid to keep your pee (urine) clear or pale yellow.  Watch your belly pain for any changes.  Keep all follow-up visits as told by your doctor. This is important. Contact a doctor if:  Your belly pain changes or  gets worse.  You are not hungry, or you lose weight without trying.  You are having trouble pooping (constipated) or have watery poop (diarrhea) for more than 2-3 days.  You have pain when you pee or poop.  Your belly pain wakes you up at night.  Your pain gets worse with meals, after eating, or with certain foods.  You are throwing up and cannot keep anything down.  You have a fever. Get help right away if:  Your pain does not go away as soon as your doctor says it should.  You cannot stop throwing up.  Your pain is only in areas of your belly, such as the right side or the left lower part of the belly.  You have bloody or black poop, or poop that looks like tar.  You have very bad pain, cramping, or bloating in your belly.  You have signs of not having enough fluid or water in your body (dehydration), such as: ? Dark pee, very little pee, or no pee. ? Cracked lips. ? Dry mouth. ? Sunken eyes. ? Sleepiness. ? Weakness. This information is not intended to replace advice given to you by your health care provider. Make sure you discuss any questions you have with your health care provider. Document Released: 09/19/2007 Document Revised: 10/21/2015 Document Reviewed: 09/14/2015 Elsevier Interactive Patient Education  2018 Elsevier Inc.      Edwina Barth, MD [facility]

## 2017-08-21 NOTE — Patient Instructions (Addendum)
IF you received an x-ray today, you will receive an invoice from [facility]. Please contact [facility] at [phone] with questions or concerns regarding your invoice.   IF you received labwork today, you will receive an invoice from [place]. Please contact [facility] at [phone] with questions or concerns regarding your invoice.   Our billing staff will not be able to assist you with questions regarding bills from these companies.  You will be contacted with the lab results as soon as they are available. The fastest way to get your results is to activate your My Chart account. Instructions are located on the last page of this paperwork. If you have not heard from [place] regarding the results in 2 weeks, please contact this office.      Dolor abdominal en los adultos Abdominal Pain, Adult El dolor de [place] (abdominal) puede tener muchas causas. La mayora de las veces, el dolor de [place] no es peligroso. Muchos de [employer] de dolor de estmago pueden controlarse y tratarse en casa. Sin embargo, a Occupational psychologist, Chief Technology Officer de [employer] puede ser grave. El mdico intentar descubrir la causa del dolor de [place]. Siga estas indicaciones en su casa:  Tome los medicamentos de venta libre y los recetados solamente como se lo haya indicado el mdico. No tome medicamentos que lo ayuden a Advertising copywriter (laxantes), salvo que el mdico se lo indique.  Beba suficiente lquido para mantener el pis (orina) claro o de color amarillo plido.  Est atento al dolor de estmago para Insurance risk surveyor cambio.  Concurra a todas las visitas de control como se lo haya indicado el mdico. Esto es importante. Comunquese con un mdico si:  El dolor de [address] cambia o [place].  No tiene apetito o baja de peso sin proponrselo.  Tiene dificultades para defecar (est estreido) o heces lquidas (diarrea) durante ms de 2 o 3das.  Siente dolor al orinar o defecar.  El dolor de estmago  lo despierta de noche.  El dolor empeora con las comidas, despus de comer o con determinados alimentos.  Vomita y no puede retener nada de lo que ingiere.  Tiene fiebre. Solicite ayuda de inmediato si:  El dolor no desaparece en el tiempo indicado por el mdico.  No puede detener los vmitos.  Siente dolor solamente en zonas especficas del abdomen, como el lado derecho o la parte inferior izquierda.  Tiene heces con sangre, de color negro o con aspecto alquitranado.  Tiene dolor muy intenso en el vientre, clicos o meteorismo.  Presenta signos de no tener suficientes lquidos o agua en el cuerpo (deshidratacin), por ejemplo: ? [place], es muy escasa o no orina. ? Labios agrietados. ? [employer]. ? Ojos hundidos. ? Somnolencia. ? Debilidad. Esta informacin no tiene Theme park manager el consejo del mdico. Asegrese de hacerle al mdico cualquier pregunta que tenga. Document Released: 06/29/2008 Document Revised: 06/27/2016 Document Reviewed: 09/14/2015 Elsevier Interactive Patient Education  2018 Elsevier Inc.  Abdominal Pain, Adult Many things can cause belly (abdominal) pain. Most times, belly pain is not dangerous. Many cases of belly pain can be watched and treated at home. Sometimes belly pain is serious, though. Your doctor will try to find the cause of your belly pain. Follow these instructions at home:  Take over-the-counter and prescription medicines only as told by your doctor. Do not take medicines that help you poop (laxatives) unless told to by your doctor.  Drink enough fluid to keep your pee (urine) clear  or pale yellow.  Watch your belly pain for any changes.  Keep all follow-up visits as told by your doctor. This is important. Contact a doctor if:  Your belly pain changes or gets worse.  You are not hungry, or you lose weight without trying.  You are having trouble pooping (constipated) or have watery poop (diarrhea) for more than 2-3  days.  You have pain when you pee or poop.  Your belly pain wakes you up at night.  Your pain gets worse with meals, after eating, or with certain foods.  You are throwing up and cannot keep anything down.  You have a fever. Get help right away if:  Your pain does not go away as soon as your doctor says it should.  You cannot stop throwing up.  Your pain is only in areas of your belly, such as the right side or the left lower part of the belly.  You have bloody or black poop, or poop that looks like tar.  You have very bad pain, cramping, or bloating in your belly.  You have signs of not having enough fluid or water in your body (dehydration), such as: ? Dark pee, very little pee, or no pee. ? Cracked lips. ? Dry mouth. ? Sunken eyes. ? Sleepiness. ? Weakness. This information is not intended to replace advice given to you by your health care provider. Make sure you discuss any questions you have with your health care provider. Document Released: 09/19/2007 Document Revised: 10/21/2015 Document Reviewed: 09/14/2015 Elsevier Interactive Patient Education  2018 [employer].

## 2017-08-22 LAB — CBC WITH DIFFERENTIAL/PLATELET
BASOS: 0 %
Basophils Absolute: 0 10*3/uL (ref 0.0–0.2)
EOS (ABSOLUTE): 0.1 10*3/uL (ref 0.0–0.4)
EOS: 1 %
HEMATOCRIT: 35.2 % (ref 34.0–46.6)
HEMOGLOBIN: 11.8 g/dL (ref 11.1–15.9)
Immature Grans (Abs): 0 10*3/uL (ref 0.0–0.1)
Immature Granulocytes: 0 %
LYMPHS ABS: 2.7 10*3/uL (ref 0.7–3.1)
Lymphs: 39 %
MCH: 29.2 pg (ref 26.6–33.0)
MCHC: 33.5 g/dL (ref 31.5–35.7)
MCV: 87 fL (ref 79–97)
MONOCYTES: 7 %
Monocytes Absolute: 0.5 10*3/uL (ref 0.1–0.9)
Neutrophils Absolute: 3.6 10*3/uL (ref 1.4–7.0)
Neutrophils: 53 %
Platelets: 239 10*3/uL (ref 150–379)
RBC: 4.04 x10E6/uL (ref 3.77–5.28)
RDW: 13.4 % (ref 12.3–15.4)
WBC: 6.8 10*3/uL (ref 3.4–10.8)

## 2017-08-22 LAB — COMPREHENSIVE METABOLIC PANEL
A/G RATIO: 1.3 (ref 1.2–2.2)
ALBUMIN: 4 g/dL (ref 3.5–5.5)
ALK PHOS: 56 IU/L (ref 39–117)
ALT: 35 IU/L — ABNORMAL HIGH (ref 0–32)
AST: 30 IU/L (ref 0–40)
BUN / CREAT RATIO: 19 (ref 9–23)
BUN: 9 mg/dL (ref 6–24)
Bilirubin Total: <0.2 mg/dL (ref 0.0–1.2)
CALCIUM: 9.1 mg/dL (ref 8.7–10.2)
CO2: 19 mmol/L — AB (ref 20–29)
CREATININE: 0.48 mg/dL — AB (ref 0.57–1.00)
Chloride: 106 mmol/L (ref 96–106)
GFR calc Af Amer: 134 mL/min/{1.73_m2} (ref >59)
GFR, EST NON AFRICAN AMERICAN: 116 mL/min/{1.73_m2} (ref >59)
GLOBULIN, TOTAL: 3.1 g/dL (ref 1.5–4.5)
Glucose: 96 mg/dL (ref 65–99)
POTASSIUM: 4 mmol/L (ref 3.5–5.2)
SODIUM: 139 mmol/L (ref 134–144)
Total Protein: 7.1 g/dL (ref 6.0–8.5)

## 2017-08-22 LAB — H. PYLORI BREATH TEST: H pylori Breath Test: POSITIVE — AB

## 2017-08-23 ENCOUNTER — Other Ambulatory Visit: Payer: Self-pay | Admitting: Emergency Medicine

## 2017-08-23 MED ORDER — AMOXICILL-CLARITHRO-LANSOPRAZ PO MISC: Freq: Two times a day (BID) | ORAL | 1 refills | Status: DC

## 2017-08-24 ENCOUNTER — Encounter: Payer: Self-pay | Admitting: Radiology

## 2017-09-19 ENCOUNTER — Ambulatory Visit: Payer: BLUE CROSS/BLUE SHIELD | Admitting: Gynecology

## 2017-09-19 ENCOUNTER — Encounter: Payer: Self-pay | Admitting: Gynecology

## 2017-09-19 VITALS — BP 124/74

## 2017-09-19 DIAGNOSIS — R300 Dysuria: Secondary | ICD-10-CM | POA: Diagnosis not present

## 2017-09-19 DIAGNOSIS — N760 Acute vaginitis: Secondary | ICD-10-CM

## 2017-09-19 LAB — WET PREP FOR TRICH, YEAST, CLUE

## 2017-09-19 MED ORDER — FLUCONAZOLE 150 MG PO TABS: 150.0000 mg | ORAL_TABLET | Freq: Once | ORAL | 0 refills | Status: AC

## 2017-09-19 MED ORDER — METRONIDAZOLE 500 MG PO TABS: 500.0000 mg | ORAL_TABLET | Freq: Two times a day (BID) | ORAL | 0 refills | Status: DC

## 2017-09-19 NOTE — Progress Notes (Signed)
    Kimberly Mccann 08/24/1970 [id number]        47 y.o.  G4P0013 4 5 days of increasing vaginal irritation, discharge and mild dysuria with frequency.  No urgency low back pain fever or chills.  No nausea vomiting diarrhea constipation.  She does note that she had completed over a week ago a course of antibiotics for H. pylori.  Past medical history,surgical history, problem list, medications, allergies, family history and social history were all reviewed and documented in the EPIC chart.  Directed ROS with pertinent positives and negatives documented in the history of present illness/assessment and plan.  Exam: Kennon PortelaKim Mccann assistant Vitals:   09/19/17 1507  BP: 124/74   General appearance:  Normal Abdomen soft nontender without masses guarding rebound Pelvic external BUS vagina with white discharge.  Bimanual without masses or tenderness.  With history and wet prep  Assessment/Plan:  47 y.o. [id number] consistent with bacterial vaginosis.  Options for management reviewed and she elects for Flagyl 500 mg twice daily x7 days.  Alcohol avoidance reviewed.  Given that she recently finished oral antibiotics and going to cover her for yeast although wet prep is negative with Diflucan 150 mg x 1 dose.  Urine analysis appears contaminated with 10-20 squamous cells but does show few bacteria 6-10 WBC.  Will culture and treat if positive.  I think her urinary symptoms are due to the urethral inflammation from her bacterial vaginosis.  She will follow-up if her symptoms persist, worsen or recur.    Dara Lordsimothy P Elai Vanwyk MD, 3:29 PM 09/19/2017

## 2017-09-19 NOTE — Addendum Note (Signed)
Addended by: Dayna BarkerGARDNER, Tevis Dunavan K on: 09/19/2017 03:51 PM   Modules accepted: Orders

## 2017-09-19 NOTE — Patient Instructions (Signed)
Take the Flagyl medication twice daily for 7 days.  Avoid alcohol while taking.  Take the Diflucan pill once to cover for yeast.  Follow-up if your symptoms persist, worsen or recur.

## 2017-09-22 LAB — URINALYSIS, COMPLETE W/RFL CULTURE
Bilirubin Urine: NEGATIVE
Glucose, UA: NEGATIVE
Hyaline Cast: NONE SEEN /LPF
Ketones, ur: NEGATIVE
NITRITES URINE, INITIAL: NEGATIVE
PH: 6 (ref 5.0–8.0)
Protein, ur: NEGATIVE
Specific Gravity, Urine: 1.025 (ref 1.001–1.03)

## 2017-09-22 LAB — URINE CULTURE
MICRO NUMBER: 90686235
SPECIMEN QUALITY: ADEQUATE

## 2017-09-22 LAB — CULTURE INDICATED

## 2017-09-23 ENCOUNTER — Other Ambulatory Visit: Payer: Self-pay | Admitting: Anesthesiology

## 2017-09-23 MED ORDER — CIPROFLOXACIN HCL 250 MG PO TABS: 250.0000 mg | ORAL_TABLET | Freq: Two times a day (BID) | ORAL | 0 refills | Status: AC

## 2017-11-04 ENCOUNTER — Other Ambulatory Visit: Payer: Self-pay

## 2017-11-04 ENCOUNTER — Telehealth: Payer: Self-pay | Admitting: *Deleted

## 2017-11-04 MED ORDER — TOLTERODINE TARTRATE ER 2 MG PO CP24: 2.0000 mg | ORAL_CAPSULE | Freq: Every day | ORAL | 0 refills | Status: DC

## 2017-11-04 NOTE — Telephone Encounter (Signed)
Former patient of Dr. Manuela SchwartzJF's. Overdue for CE having seen him last year 05/16/16. He wrote " 47 y.o. 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 We discussed different treatment options she will be given instructions on [place] exercises and will be prescribed Detrol LA 2 mg one by mouth daily every morning. The risks benefits and pros and cons were discussed."  She has CE scheduled with you for 11/12/17.

## 2017-11-04 NOTE — Telephone Encounter (Signed)
Refill request from pharmacy sent fax. Olegario MessierKathy sent refill requesting to Dr.Fontaine

## 2017-11-04 NOTE — Telephone Encounter (Signed)
Patient called and left message in triage requesting a refill on medication not able to understand the name of medication. I asked patient to call me or speak with Debarah CrapeClaudia about the refill.

## 2017-11-12 ENCOUNTER — Other Ambulatory Visit: Payer: Self-pay | Admitting: Gynecology

## 2017-11-12 ENCOUNTER — Ambulatory Visit: Payer: BLUE CROSS/BLUE SHIELD | Admitting: Gynecology

## 2017-11-12 ENCOUNTER — Encounter: Payer: Self-pay | Admitting: Gynecology

## 2017-11-12 VITALS — BP 122/80 | Ht 63.0 in | Wt 163.0 lb

## 2017-11-12 DIAGNOSIS — Z01419 Encounter for gynecological examination (general) (routine) without abnormal findings: Secondary | ICD-10-CM

## 2017-11-12 DIAGNOSIS — Z30431 Encounter for routine checking of intrauterine contraceptive device: Secondary | ICD-10-CM

## 2017-11-12 DIAGNOSIS — Z1231 Encounter for screening mammogram for malignant neoplasm of breast: Secondary | ICD-10-CM

## 2017-11-12 NOTE — Progress Notes (Signed)
[id number]  

## 2017-11-12 NOTE — Patient Instructions (Signed)
Follow-up in 1 year for annual exam, sooner as needed. 

## 2017-11-12 NOTE — Progress Notes (Signed)
    Kimberly HubertCecilia Mccann 08/08/1970 [id number]        47 y.o.  [id number] for annual gynecologic exam.  Former patient of Dr. Lily Mccann.  Without gynecologic complaints.  Several issues noted below.  Past medical history,surgical history, problem list, medications, allergies, family history and social history were all reviewed and documented as reviewed in the EPIC chart.  ROS:  Performed with pertinent positives and negatives included in the history, assessment and plan.   Additional significant findings : None   Exam: Kimberly MantisKim Mccann assistant Vitals:   11/12/17 0906  BP: 122/80  Weight: 163 lb (73.9 kg)  Height: 5\' 3"  (1.6 m)   Body mass index is 28.87 kg/m.  General appearance:  Normal affect, orientation and appearance. Skin: Grossly normal HEENT: Without gross lesions.  No cervical or supraclavicular adenopathy. Thyroid normal.  Lungs:  Clear without wheezing, rales or rhonchi Cardiac: RR, without RMG Abdominal:  Soft, nontender, without masses, guarding, rebound, organomegaly or hernia Breasts:  Examined lying and sitting without masses, retractions, discharge or axillary adenopathy.  Left nipple inverted as always since breast-feeding Pelvic:  Ext, BUS, Vagina: Normal  Cervix: Normal.  IUD string visualized.  Pap smear/HPV  Uterus: Anteverted, normal size, shape and contour, midline and mobile nontender   Adnexa: Without masses or tenderness    Anus and perineum: Normal   Rectovaginal: Normal sphincter tone without palpated masses or tenderness.    Assessment/Plan:  47 y.o. 44P0013 female for annual gynecologic exam with regular menses, ParaGard IUD.   1. ParaGard IUD 08/2014.  Doing well with IUD string visualized. 2. Pap smear 2016.  Pap smear/HPV today.  History of laser of the cervix for dysplasia.  Pap smears normal since. 3. Mammography 05/2016.  Patient knows she is overdue and agrees to call and schedule.  Breast exam normal today.  Left nipple inverted but reverts easily.   Has been so ever since breast-feeding. 4. History of hyperprolactinemia being treated with Dostinex in the past.  Has been off of this for years.  last prolactin checks x2 have been normal.  No galactorrhea now.  No headaches/visual changes.  Will stop checking prolactin's. 5. Health maintenance.  Most recently had CBC and CMP which were normal.  Lipid profile last year excellent.  History of low vitamin D in the past taking vitamin D supplements.  No lab work ordered today.  Follow-up in 1 year, sooner as needed.   Kimberly Lordsimothy P Fontaine MD, 9:23 AM 11/12/2017

## 2017-11-13 LAB — PAP, TP IMAGING W/ HPV RNA, RFLX HPV TYPE 16,18/45: HPV DNA High Risk: NOT DETECTED

## 2017-11-22 ENCOUNTER — Ambulatory Visit: Payer: BLUE CROSS/BLUE SHIELD | Admitting: Physician Assistant

## 2017-11-22 ENCOUNTER — Encounter: Payer: Self-pay | Admitting: Physician Assistant

## 2017-11-22 VITALS — BP 126/74 | HR 88 | Temp 98.5°F | Ht 63.0 in | Wt 161.4 lb

## 2017-11-22 DIAGNOSIS — R079 Chest pain, unspecified: Secondary | ICD-10-CM | POA: Diagnosis not present

## 2017-11-22 NOTE — Progress Notes (Signed)
Kimberly Mccann is a 47 y.o. female here for a new problem.  History of Present Illness:   Chief Complaint  Patient presents with  . Acute Visit    HPI   For the past three weeks she had a "cramp" on the left side of her chest. Lasts a few seconds and then goes away. Does not have cardiac history. 3 weeks ago her son went to the hospital, she had some anxiety associated with this. Doesn't take anything for her symptoms. Has had some recent congestion. Does have heartburn, when she drinks milk in the mornings, takes Tums to help with her symptoms occasionally.   This happened before in September last year and she was told that she was probably a little dehydrated.  She denies any exertional component to her symptoms.  She denies shortness of breath, nausea, vomiting, diaphoresis, changes in vision.  Past Medical History:  Diagnosis Date  . Allergy    Seasonal allergies  . Anemia   . Dysplasia of cervix   . Hyperprolactinemia (HCC)   . Overactive bladder   . Vitamin D deficiency 05/2016     Social History   Socioeconomic History  . Marital status: Married    Spouse name: Not on file  . Number of children: Not on file  . Years of education: Not on file  . Highest education level: Not on file  Occupational History  . Not on file  Social Needs  . Financial resource strain: Not on file  . Food insecurity:    Worry: Not on file    Inability: Not on file  . Transportation needs:    Medical: Not on file    Non-medical: Not on file  Tobacco Use  . Smoking status: Never Smoker  . Smokeless tobacco: Never Used  Substance and Sexual Activity  . Alcohol use: No    Alcohol/week: 0.0 standard drinks  . Drug use: No  . Sexual activity: Yes    Birth control/protection: IUD, None    Comment: 1st intercourse 16 yo-5 partners-Paragard inserted 08-2014  Lifestyle  . Physical activity:    Days per week: Not on file    Minutes per session: Not on file  . Stress: Not on file    Relationships  . Social connections:    Talks on phone: Not on file    Gets together: Not on file    Attends religious service: Not on file    Active member of club or organization: Not on file    Attends meetings of clubs or organizations: Not on file    Relationship status: Not on file  . Intimate partner violence:    Fear of current or ex partner: Not on file    Emotionally abused: Not on file    Physically abused: Not on file    Forced sexual activity: Not on file  Other Topics Concern  . Not on file  Social History Narrative  . Not on file    Past Surgical History:  Procedure Laterality Date  . CHOLECYSTECTOMY     9 years ago  . INTRAUTERINE DEVICE INSERTION  08/2014   ParaGard    Family History  Problem Relation Age of Onset  . Hypertension Mother   . Multiple sclerosis Brother   . Multiple sclerosis Sister     No Known Allergies  Current Medications:   Current Outpatient Medications:  .  acetaminophen (TYLENOL) 500 MG tablet, Take 500 mg by mouth every 6 (six) hours as needed.,  Disp: , Rfl:  .  CALCIUM PO, Take 1 tablet by mouth daily., Disp: , Rfl:  .  Cetirizine HCl (ZYRTEC ALLERGY) 10 MG CAPS, Take by mouth., Disp: , Rfl:  .  Multiple Vitamin (MULTIVITAMIN) capsule, Take 1 capsule by mouth daily., Disp: , Rfl:  .  tolterodine (DETROL LA) 2 MG 24 hr capsule, Take 1 capsule (2 mg total) by mouth daily., Disp: 30 capsule, Rfl: 0   Review of Systems:   ROS  Negative unless otherwise specified per HPI.   Vitals:   Vitals:   11/22/17 1256  BP: 126/74  Pulse: 88  Temp: 98.5 F (36.9 C)  TempSrc: Oral  SpO2: 97%  Weight: 161 lb 6.4 oz (73.2 kg)  Height: 5\' 3"  (1.6 m)     Body mass index is 28.59 kg/m.  Physical Exam:   Physical Exam  Constitutional: She appears well-developed. She is cooperative.  Non-toxic appearance. She does not have a sickly appearance. She does not appear ill. No distress.  Cardiovascular: Normal rate, regular rhythm, S1  normal, S2 normal, normal heart sounds and normal pulses.  No LE edema  Pulmonary/Chest: Effort normal and breath sounds normal.    Abdominal: Normal appearance and bowel sounds are normal. There is no tenderness.  Neurological: She is alert. GCS eye subscore is 4. GCS verbal subscore is 5. GCS motor subscore is 6.  Skin: Skin is warm, dry and intact.  Psychiatric: She has a normal mood and affect. Her speech is normal and behavior is normal.  Nursing note and vitals reviewed.    EKG tracing is personally reviewed.  EKG notes NSR.  No acute changes.   Assessment and Plan:    Robie was seen today for acute visit.  Diagnoses and all orders for this visit:  Chest pain, unspecified type -     EKG 12-Lead  EKG was compared to prior EKG, no changes at this time.  I do think that there is an anxiety component to this, as well as a musculoskeletal component.  We also did discuss today starting a daily maintenance medication for GERD, such as Zantac or Pepcid, available over-the-counter.  No red flags on exam.  We discussed taking ibuprofen as needed for musculoskeletal pain.  She states that she would like a second opinion about her symptoms, and would like a cardiology referral.  I am going to put in a cardiology referral at this time.  I recommended that if her symptoms change in any way, to let us know.  Patient is agreeable to plan.  . Reviewed expectations re: course of current medical issues. . Discussed self-management of symptoms. . Outlined signs and symptoms indicating need for more acute intervention. . Patient verbalized understanding and all questions were answered. . See orders for this visit as documented in the electronic medical record. . Patient received an After-Visit Summary.   Jarold Motto, PA-C

## 2017-11-22 NOTE — Patient Instructions (Signed)
It was great to see you!  You will be contacted about your cardiology appointment. If your symptoms change or worsen in any way, please go to the ER.  Consider taking a daily over the counter medication for your heartburn, such as Zantac or Pepcid.  Take care,  Jarold MottoSamantha Griffon Herberg PA-C

## 2017-12-04 ENCOUNTER — Ambulatory Visit
Admission: RE | Admit: 2017-12-04 | Discharge: 2017-12-04 | Disposition: A | Discharge status: Home / Self Care | Payer: BLUE CROSS/BLUE SHIELD | Source: Ambulatory Visit | Attending: Gynecology | Admitting: Gynecology

## 2017-12-04 DIAGNOSIS — Z1231 Encounter for screening mammogram for malignant neoplasm of breast: Secondary | ICD-10-CM

## 2018-01-03 ENCOUNTER — Ambulatory Visit: Payer: BLUE CROSS/BLUE SHIELD | Admitting: Cardiology

## 2018-01-17 ENCOUNTER — Telehealth: Payer: Self-pay | Admitting: *Deleted

## 2018-01-17 NOTE — Telephone Encounter (Signed)
Referral Request Placed in Scheduling Box. There were no notes attached.  [facility]  P# [phone] F# [phone] 

## 2018-01-28 ENCOUNTER — Other Ambulatory Visit: Payer: Self-pay | Admitting: Gynecology

## 2018-01-28 NOTE — Telephone Encounter (Signed)
Last filled on 11/04/17

## 2018-05-15 NOTE — Progress Notes (Signed)
Kimberly Mccann is a 48 y.o. female is here for follow up.  History of Present Illness:   Kimberly Mccann, Kimberly Mccann acting as scribe for Dr. Helane Rima.   HPI: Patient in office for ongoing bloating and abdominal pain. Pain in epigastric region. Status post H. Pylori treatment. Pain worse after meals or when lying on back. No cough. No bowel changes. No bloody stools, no melena. Not taking any medications for epigastric pain now. Wants to see GI.  Health Maintenance Due  Topic Date Due  . HIV Screening  06/22/1984   Depression screen [facility] 2/9 08/21/2017 08/02/2017 01/08/2017  Decreased Interest 0 0 0  Down, Depressed, Hopeless 0 0 0  PHQ - 2 Score 0 0 0   PMHx, SurgHx, SocialHx, FamHx, Medications, and Allergies were reviewed in the Visit Navigator and updated as appropriate.   Patient Active Problem List   Diagnosis Date Noted  . Epigastric pain 08/21/2017  . History of Helicobacter pylori infection 08/21/2017  . OAB (overactive bladder) 09/24/2014  . IUD (intrauterine device) in place 08/18/2014  . History of hyperprolactinemia 11/11/2013  . Hair loss 11/11/2013  . History of cervical dysplasia 11/11/2013   Social History   Tobacco Use  . Smoking status: Never Smoker  . Smokeless tobacco: Never Used  Substance Use Topics  . Alcohol use: No    Alcohol/week: 0.0 standard drinks  . Drug use: No   Current Medications and Allergies   Current Outpatient Medications:  .  acetaminophen (TYLENOL) 500 MG tablet, Take 500 mg by mouth every 6 (six) hours as needed., Disp: , Rfl:  .  CALCIUM PO, Take 1 tablet by mouth daily., Disp: , Rfl:  .  Cetirizine HCl (ZYRTEC ALLERGY) 10 MG CAPS, Take by mouth., Disp: , Rfl:  .  Multiple Vitamin (MULTIVITAMIN) capsule, Take 1 capsule by mouth daily., Disp: , Rfl:  .  tolterodine (DETROL LA) 2 MG 24 hr capsule, TAKE 1 CAPSULE BY MOUTH ONCE DAILY, Disp: 30 capsule, Rfl: 10  No Known Allergies Review of Systems   Pertinent items are noted in  the HPI. Otherwise, a complete ROS is negative.  Vitals   Vitals:   05/16/18 1513  BP: 126/74  Pulse: 76  Temp: 98 F (36.7 C)  TempSrc: Oral  SpO2: 100%  Weight: 160 lb 12.8 oz (72.9 kg)  Height: 5\' 3"  (1.6 m)     Body mass index is 28.48 kg/m.  Physical Exam   Physical Exam Vitals signs and nursing note reviewed.  HENT:     Head: Normocephalic and atraumatic.  Eyes:     Pupils: Pupils are equal, round, and reactive to light.  Neck:     Musculoskeletal: Normal range of motion and neck supple.  Cardiovascular:     Rate and Rhythm: Normal rate and regular rhythm.     Heart sounds: Normal heart sounds.  Pulmonary:     Effort: Pulmonary effort is normal.  Abdominal:     Palpations: Abdomen is soft.  Skin:    General: Skin is warm.  Psychiatric:        Behavior: Behavior normal.     Assessment and Plan   Kimberly Mccann was seen today for referral.  Diagnoses and all orders for this visit:  Dyspepsia Comments: Offered labs, PPI, diet education. Patient wanted referral to GI only. Will do. Orders: -     Ambulatory referral to Gastroenterology  History of Helicobacter pylori infection -     Ambulatory referral to Gastroenterology   .  Orders and follow up as documented in EpicCare, reviewed diet, exercise and weight control, cardiovascular risk and specific lipid/LDL goals reviewed, reviewed medications and side effects in detail.  . Reviewed expectations re: course of current medical issues. . Outlined signs and symptoms indicating need for more acute intervention. . Patient verbalized understanding and all questions were answered. . Patient received an After Visit Summary.  Kimberly Mccann served as Neurosurgeon during this visit. History, Physical, and Plan performed by medical provider. The above documentation has been reviewed and is accurate and complete. Helane Rima, D.O.  Helane Rima, DO [place] 05/17/2018

## 2018-05-16 ENCOUNTER — Ambulatory Visit: Payer: BLUE CROSS/BLUE SHIELD | Admitting: Family Medicine

## 2018-05-16 VITALS — BP 126/74 | HR 76 | Temp 98.0°F | Ht 63.0 in | Wt 160.8 lb

## 2018-05-16 DIAGNOSIS — R1013 Epigastric pain: Secondary | ICD-10-CM | POA: Diagnosis not present

## 2018-05-16 DIAGNOSIS — Z8619 Personal history of other infectious and parasitic diseases: Secondary | ICD-10-CM

## 2018-05-17 ENCOUNTER — Encounter: Payer: Self-pay | Admitting: Family Medicine

## 2018-05-21 ENCOUNTER — Ambulatory Visit: Payer: BLUE CROSS/BLUE SHIELD | Admitting: Cardiology

## 2018-07-01 ENCOUNTER — Other Ambulatory Visit: Payer: Self-pay | Admitting: Family Medicine

## 2018-07-01 DIAGNOSIS — J309 Allergic rhinitis, unspecified: Secondary | ICD-10-CM

## 2018-09-18 ENCOUNTER — Other Ambulatory Visit: Payer: Self-pay | Admitting: Family Medicine

## 2018-09-18 DIAGNOSIS — J309 Allergic rhinitis, unspecified: Secondary | ICD-10-CM

## 2019-01-07 ENCOUNTER — Encounter: Payer: Self-pay | Admitting: Gynecology

## 2019-02-25 ENCOUNTER — Other Ambulatory Visit: Payer: Self-pay | Admitting: Family Medicine

## 2019-02-25 DIAGNOSIS — J309 Allergic rhinitis, unspecified: Secondary | ICD-10-CM

## 2019-02-25 NOTE — Telephone Encounter (Signed)
Refilled x 30 days. Please contact pt to schedule OV with new PCP. Thanks!

## 2019-02-27 NOTE — Telephone Encounter (Signed)
Called pt to schedule. No answer, LVM.  °

## 2019-03-02 NOTE — Telephone Encounter (Signed)
Called pt a second time to schedule, no answer, LVM.  °

## 2019-04-12 ENCOUNTER — Other Ambulatory Visit: Payer: Self-pay | Admitting: Gynecology

## 2019-04-13 ENCOUNTER — Other Ambulatory Visit: Payer: Self-pay

## 2019-04-13 NOTE — Telephone Encounter (Signed)
Encounter not needed

## 2020-04-15 ENCOUNTER — Other Ambulatory Visit: Payer: Self-pay | Admitting: Family Medicine

## 2020-04-15 DIAGNOSIS — J309 Allergic rhinitis, unspecified: Secondary | ICD-10-CM

## 2020-04-18 NOTE — Telephone Encounter (Signed)
Dr. Earlene Plater 02/25/19

## 2023-05-30 ENCOUNTER — Emergency Department (HOSPITAL_COMMUNITY)
Admission: EM | Admit: 2023-05-30 | Discharge: 2023-05-30 | Discharge status: Against Medical Advice | Payer: BLUE CROSS/BLUE SHIELD

## 2023-05-30 NOTE — ED Notes (Signed)
Advised sort staff she was leaving
